# Patient Record
Sex: Male | Born: 1975 | Race: White | Hispanic: No | Marital: Single | Smoking: Former smoker
Health system: Southern US, Community
[De-identification: ages and names within clinical notes are randomized; demographics above are authoritative.]

## PROBLEM LIST (undated history)

## (undated) DIAGNOSIS — E119 Type 2 diabetes mellitus without complications: Secondary | ICD-10-CM

## (undated) DIAGNOSIS — F419 Anxiety disorder, unspecified: Secondary | ICD-10-CM

## (undated) DIAGNOSIS — F99 Mental disorder, not otherwise specified: Secondary | ICD-10-CM

## (undated) DIAGNOSIS — F329 Major depressive disorder, single episode, unspecified: Secondary | ICD-10-CM

## (undated) DIAGNOSIS — R42 Dizziness and giddiness: Secondary | ICD-10-CM

## (undated) DIAGNOSIS — X838XXA Intentional self-harm by other specified means, initial encounter: Secondary | ICD-10-CM

## (undated) DIAGNOSIS — R569 Unspecified convulsions: Secondary | ICD-10-CM

## (undated) DIAGNOSIS — G4730 Sleep apnea, unspecified: Secondary | ICD-10-CM

## (undated) DIAGNOSIS — E669 Obesity, unspecified: Secondary | ICD-10-CM

## (undated) DIAGNOSIS — I10 Essential (primary) hypertension: Secondary | ICD-10-CM

## (undated) DIAGNOSIS — F32A Depression, unspecified: Secondary | ICD-10-CM

## (undated) DIAGNOSIS — G2581 Restless legs syndrome: Secondary | ICD-10-CM

## (undated) HISTORY — PX: VAGUS NERVE STIMULATOR INSERTION: SHX348

## (undated) HISTORY — DX: Restless legs syndrome: G25.81

## (undated) HISTORY — DX: Obesity, unspecified: E66.9

## (undated) HISTORY — PX: SHOULDER SURGERY: SHX246

## (undated) HISTORY — DX: Dizziness and giddiness: R42

## (undated) HISTORY — PX: BURN DEBRIDEMENT SURGERY: SHX582

## (undated) HISTORY — DX: Intentional self-harm by other specified means, initial encounter: X83.8XXA

## (undated) HISTORY — PX: JOINT REPLACEMENT: SHX530

---

## 1997-05-19 ENCOUNTER — Emergency Department (HOSPITAL_COMMUNITY): Admission: EM | Admit: 1997-05-19 | Discharge: 1997-05-19 | Payer: Self-pay | Admitting: Emergency Medicine

## 1997-06-01 ENCOUNTER — Ambulatory Visit (HOSPITAL_COMMUNITY): Admission: RE | Admit: 1997-06-01 | Discharge: 1997-06-01 | Payer: Self-pay

## 1997-07-30 ENCOUNTER — Ambulatory Visit (HOSPITAL_COMMUNITY): Admission: RE | Admit: 1997-07-30 | Discharge: 1997-07-30 | Payer: Self-pay

## 1998-05-21 ENCOUNTER — Emergency Department (HOSPITAL_COMMUNITY): Admission: EM | Admit: 1998-05-21 | Discharge: 1998-05-21 | Payer: Self-pay | Admitting: Emergency Medicine

## 1999-03-17 ENCOUNTER — Emergency Department (HOSPITAL_COMMUNITY): Admission: EM | Admit: 1999-03-17 | Discharge: 1999-03-17 | Payer: Self-pay | Admitting: Emergency Medicine

## 1999-08-16 ENCOUNTER — Encounter: Payer: Self-pay | Admitting: Emergency Medicine

## 1999-08-16 ENCOUNTER — Emergency Department (HOSPITAL_COMMUNITY): Admission: EM | Admit: 1999-08-16 | Discharge: 1999-08-16 | Payer: Self-pay | Admitting: Emergency Medicine

## 2000-05-29 ENCOUNTER — Ambulatory Visit (HOSPITAL_COMMUNITY): Admission: RE | Admit: 2000-05-29 | Discharge: 2000-05-29 | Payer: Self-pay | Admitting: Family Medicine

## 2000-05-29 ENCOUNTER — Encounter: Payer: Self-pay | Admitting: Family Medicine

## 2000-10-25 ENCOUNTER — Emergency Department (HOSPITAL_COMMUNITY): Admission: EM | Admit: 2000-10-25 | Discharge: 2000-10-25 | Payer: Self-pay | Admitting: Emergency Medicine

## 2001-03-25 ENCOUNTER — Emergency Department (HOSPITAL_COMMUNITY): Admission: EM | Admit: 2001-03-25 | Discharge: 2001-03-25 | Payer: Self-pay | Admitting: Emergency Medicine

## 2002-08-31 ENCOUNTER — Ambulatory Visit (HOSPITAL_COMMUNITY): Admission: RE | Admit: 2002-08-31 | Discharge: 2002-08-31 | Payer: Self-pay | Admitting: Family Medicine

## 2002-08-31 ENCOUNTER — Encounter: Payer: Self-pay | Admitting: Family Medicine

## 2003-08-26 ENCOUNTER — Emergency Department (HOSPITAL_COMMUNITY): Admission: EM | Admit: 2003-08-26 | Discharge: 2003-08-26 | Payer: Self-pay | Admitting: Emergency Medicine

## 2004-02-05 ENCOUNTER — Emergency Department (HOSPITAL_COMMUNITY): Admission: EM | Admit: 2004-02-05 | Discharge: 2004-02-05 | Payer: Self-pay | Admitting: Emergency Medicine

## 2004-05-10 ENCOUNTER — Emergency Department (HOSPITAL_COMMUNITY): Admission: EM | Admit: 2004-05-10 | Discharge: 2004-05-10 | Payer: Self-pay | Admitting: Emergency Medicine

## 2004-07-02 ENCOUNTER — Emergency Department (HOSPITAL_COMMUNITY): Admission: EM | Admit: 2004-07-02 | Discharge: 2004-07-02 | Payer: Self-pay | Admitting: Emergency Medicine

## 2004-09-03 ENCOUNTER — Emergency Department (HOSPITAL_COMMUNITY): Admission: EM | Admit: 2004-09-03 | Discharge: 2004-09-03 | Payer: Self-pay | Admitting: *Deleted

## 2004-09-27 ENCOUNTER — Encounter (HOSPITAL_COMMUNITY): Admission: RE | Admit: 2004-09-27 | Discharge: 2004-12-26 | Payer: Self-pay | Admitting: Internal Medicine

## 2004-12-07 ENCOUNTER — Emergency Department (HOSPITAL_COMMUNITY): Admission: EM | Admit: 2004-12-07 | Discharge: 2004-12-07 | Payer: Self-pay | Admitting: Emergency Medicine

## 2004-12-08 ENCOUNTER — Emergency Department (HOSPITAL_COMMUNITY): Admission: EM | Admit: 2004-12-08 | Discharge: 2004-12-09 | Payer: Self-pay | Admitting: Emergency Medicine

## 2005-01-26 ENCOUNTER — Encounter: Admission: RE | Admit: 2005-01-26 | Discharge: 2005-02-21 | Payer: Self-pay | Admitting: Orthopedic Surgery

## 2005-02-22 ENCOUNTER — Encounter: Admission: RE | Admit: 2005-02-22 | Discharge: 2005-05-23 | Payer: Self-pay | Admitting: Orthopedic Surgery

## 2005-04-17 ENCOUNTER — Emergency Department (HOSPITAL_COMMUNITY): Admission: EM | Admit: 2005-04-17 | Discharge: 2005-04-17 | Payer: Self-pay | Admitting: Family Medicine

## 2005-11-11 ENCOUNTER — Emergency Department (HOSPITAL_COMMUNITY): Admission: EM | Admit: 2005-11-11 | Discharge: 2005-11-11 | Payer: Self-pay | Admitting: Emergency Medicine

## 2007-01-07 ENCOUNTER — Inpatient Hospital Stay (HOSPITAL_COMMUNITY): Admission: EM | Admit: 2007-01-07 | Discharge: 2007-01-07 | Payer: Self-pay | Admitting: Emergency Medicine

## 2007-05-16 ENCOUNTER — Emergency Department (HOSPITAL_COMMUNITY): Admission: EM | Admit: 2007-05-16 | Discharge: 2007-05-17 | Payer: Self-pay | Admitting: Emergency Medicine

## 2008-07-21 ENCOUNTER — Emergency Department (HOSPITAL_COMMUNITY): Admission: EM | Admit: 2008-07-21 | Discharge: 2008-07-22 | Payer: Self-pay | Admitting: Emergency Medicine

## 2008-08-10 ENCOUNTER — Emergency Department (HOSPITAL_COMMUNITY): Admission: EM | Admit: 2008-08-10 | Discharge: 2008-08-10 | Payer: Self-pay | Admitting: Emergency Medicine

## 2009-04-25 ENCOUNTER — Emergency Department (HOSPITAL_COMMUNITY): Admission: EM | Admit: 2009-04-25 | Discharge: 2009-04-25 | Payer: Self-pay | Admitting: Emergency Medicine
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
## 2010-05-07 ENCOUNTER — Emergency Department (HOSPITAL_COMMUNITY)
Admission: EM | Admit: 2010-05-07 | Discharge: 2010-05-07 | Disposition: A | Discharge status: Home / Self Care | Payer: Medicare Other | Attending: Emergency Medicine | Admitting: Emergency Medicine

## 2010-05-07 ENCOUNTER — Emergency Department (HOSPITAL_COMMUNITY): Payer: Medicare Other

## 2010-05-07 DIAGNOSIS — S93409A Sprain of unspecified ligament of unspecified ankle, initial encounter: Secondary | ICD-10-CM | POA: Insufficient documentation

## 2010-05-07 DIAGNOSIS — S52599A Other fractures of lower end of unspecified radius, initial encounter for closed fracture: Secondary | ICD-10-CM | POA: Insufficient documentation

## 2010-05-07 DIAGNOSIS — I10 Essential (primary) hypertension: Secondary | ICD-10-CM | POA: Insufficient documentation

## 2010-05-07 DIAGNOSIS — IMO0002 Reserved for concepts with insufficient information to code with codable children: Secondary | ICD-10-CM | POA: Insufficient documentation

## 2010-05-07 DIAGNOSIS — M79609 Pain in unspecified limb: Secondary | ICD-10-CM | POA: Insufficient documentation

## 2010-05-07 DIAGNOSIS — G40909 Epilepsy, unspecified, not intractable, without status epilepticus: Secondary | ICD-10-CM | POA: Insufficient documentation

## 2010-05-07 DIAGNOSIS — S0990XA Unspecified injury of head, initial encounter: Secondary | ICD-10-CM | POA: Insufficient documentation

## 2010-05-07 DIAGNOSIS — Y92410 Unspecified street and highway as the place of occurrence of the external cause: Secondary | ICD-10-CM | POA: Insufficient documentation

## 2010-05-07 DIAGNOSIS — S0180XA Unspecified open wound of other part of head, initial encounter: Secondary | ICD-10-CM | POA: Insufficient documentation

## 2010-05-07 DIAGNOSIS — R079 Chest pain, unspecified: Secondary | ICD-10-CM | POA: Insufficient documentation

## 2010-05-22 LAB — PHENYTOIN LEVEL, TOTAL
Phenytoin Lvl: 5.4 ug/mL — ABNORMAL LOW (ref 10.0–20.0)
Phenytoin Lvl: 5.7 ug/mL — ABNORMAL LOW (ref 10.0–20.0)

## 2010-06-27 NOTE — Procedures (Signed)
EEG NUMBER:  [phone]   CLINICAL HISTORY:  The patient is a 35 year old with a history of  seizure disorder who was admitted in status epilepticus related to  medical noncompliance.  He is obese.  He has a history of bipolar  affective disorder. (345.10)   CURRENT MEDICATIONS INCLUDE:  1. Lyrica.  2. Dilantin.  3. Lamictal.  4. He also received Ativan.   PROCEDURE:  The tracing was carried out on a 32-channel digital Cadwell  recorder reformatted into 16-channel montages, with one devoted to EKG.  The patient was awake and drowsy during the recording.  The  international 10/20 system lead placement was used.   DESCRIPTION OF FINDINGS:  Dominant frequency is a 5 Hz 25 mV activity  that is present when the patient is clearly awake.  Background activity  during drowsiness is mixed frequency theta and frontally predominant  delta range activity, the delta ranging from 25-50 mV distributed in the  frontal and central regions.  Background was symmetric.  There was no  focal slowing.  There was a single sharp transient at F4 (right frontal  region).  This is not definitely epileptogenic from an electrographic  viewpoint.  There is no focal slowing.  EKG showed a regular sinus  rhythm with ventricular response of 96 beats per minute.  Photic  stimulation failed to induce a driving response.  Hyperventilation  caused arousal in the background over a 5-minute period.   IMPRESSION:  Abnormal electroencephalogram on the basis of diffuse  background slowing.  This is a nonspecific indicator of neuronal  dysfunction that may be on a primary degenerative basis or secondary to  a variety of toxic or metabolic etiologies including postictal state and  medication effect.      Deanna Artis. Sharene Skeans, M.D.  Electronically Signed     EAV:WUJW  D:  01/07/2007 12:53:29  T:  01/07/2007 14:08:14  Job #:  [id number]   cc:   Marlan Palau, M.D.  Fax: [phone]
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
## 2010-11-07 LAB — URINALYSIS, ROUTINE W REFLEX MICROSCOPIC
Bilirubin Urine: NEGATIVE
Glucose, UA: NEGATIVE
Ketones, ur: NEGATIVE
Leukocytes, UA: NEGATIVE
Nitrite: NEGATIVE
Protein, ur: 100 — AB
Specific Gravity, Urine: 1.022
Urobilinogen, UA: 0.2
pH: 5

## 2010-11-07 LAB — PHENYTOIN LEVEL, TOTAL: Phenytoin Lvl: 6.7 — ABNORMAL LOW

## 2010-11-07 LAB — POCT I-STAT, CHEM 8
BUN: 18
Calcium, Ion: 1.23
Chloride: 110
Creatinine, Ser: 1
Glucose, Bld: 129 — ABNORMAL HIGH
HCT: 53 — ABNORMAL HIGH
Hemoglobin: 18 — ABNORMAL HIGH
Potassium: 3.5
Sodium: 146 — ABNORMAL HIGH
TCO2: 15

## 2010-11-07 LAB — URINE MICROSCOPIC-ADD ON

## 2010-11-07 LAB — HEPATITIS B SURFACE ANTIGEN: Hepatitis B Surface Ag: NEGATIVE

## 2010-11-07 LAB — HEPATITIS C ANTIBODY, REFLEX: HCV Ab: NEGATIVE

## 2010-11-07 LAB — CARBAMAZEPINE LEVEL, TOTAL: Carbamazepine Lvl: <2 — ABNORMAL LOW

## 2010-11-07 LAB — HIV RAPID SCREEN (BLD OR BODY FLD EXPOSURE): Rapid HIV Screen: NONREACTIVE

## 2010-11-20 ENCOUNTER — Ambulatory Visit
Admission: RE | Admit: 2010-11-20 | Discharge: 2010-11-20 | Disposition: A | Discharge status: Home / Self Care | Payer: Medicare Other | Source: Ambulatory Visit | Attending: Neurology | Admitting: Neurology

## 2010-11-20 ENCOUNTER — Other Ambulatory Visit: Payer: Self-pay | Admitting: Neurology

## 2010-11-20 DIAGNOSIS — G40909 Epilepsy, unspecified, not intractable, without status epilepticus: Secondary | ICD-10-CM

## 2010-11-21 LAB — BASIC METABOLIC PANEL
CO2: 18 — ABNORMAL LOW
CO2: 25
Calcium: 8.9
Calcium: 9
Chloride: 109
Creatinine, Ser: 0.99
GFR calc Af Amer: >60
Glucose, Bld: 114 — ABNORMAL HIGH
Potassium: 4.1
Sodium: 143

## 2010-11-21 LAB — CBC
HCT: 42.5
Hemoglobin: 14.5
MCHC: 34.2
MCHC: 34.5
MCV: 93.7
RBC: 4.71
RDW: 13.1
WBC: 17.8 — ABNORMAL HIGH

## 2010-11-21 LAB — VALPROIC ACID LEVEL: Valproic Acid Lvl: <10 — ABNORMAL LOW

## 2010-11-21 LAB — DIFFERENTIAL
Basophils Relative: 0
Monocytes Relative: 3
Neutro Abs: 16.5 — ABNORMAL HIGH
Neutrophils Relative %: 93 — ABNORMAL HIGH

## 2010-11-21 LAB — PHENYTOIN LEVEL, TOTAL: Phenytoin Lvl: 4.6 — ABNORMAL LOW

## 2010-11-21 LAB — RAPID URINE DRUG SCREEN, HOSP PERFORMED
Barbiturates: NOT DETECTED
Benzodiazepines: NOT DETECTED

## 2011-03-06 DIAGNOSIS — G40309 Generalized idiopathic epilepsy and epileptic syndromes, not intractable, without status epilepticus: Secondary | ICD-10-CM | POA: Diagnosis not present

## 2011-03-06 DIAGNOSIS — R490 Dysphonia: Secondary | ICD-10-CM | POA: Diagnosis not present

## 2011-03-07 DIAGNOSIS — G40309 Generalized idiopathic epilepsy and epileptic syndromes, not intractable, without status epilepticus: Secondary | ICD-10-CM | POA: Diagnosis not present

## 2011-03-07 DIAGNOSIS — Z5181 Encounter for therapeutic drug level monitoring: Secondary | ICD-10-CM | POA: Diagnosis not present

## 2011-03-20 DIAGNOSIS — J3800 Paralysis of vocal cords and larynx, unspecified: Secondary | ICD-10-CM | POA: Diagnosis not present

## 2011-03-20 DIAGNOSIS — R490 Dysphonia: Secondary | ICD-10-CM | POA: Diagnosis not present

## 2011-03-22 ENCOUNTER — Other Ambulatory Visit (HOSPITAL_COMMUNITY): Payer: Self-pay | Admitting: Otolaryngology

## 2011-03-27 ENCOUNTER — Encounter (HOSPITAL_COMMUNITY): Payer: Self-pay | Admitting: Pharmacy Technician

## 2011-04-02 ENCOUNTER — Encounter (HOSPITAL_COMMUNITY)
Admission: RE | Admit: 2011-04-02 | Discharge: 2011-04-02 | Disposition: A | Discharge status: Home / Self Care | Payer: Medicare Other | Source: Ambulatory Visit | Attending: Otolaryngology | Admitting: Otolaryngology

## 2011-04-02 ENCOUNTER — Encounter (HOSPITAL_COMMUNITY)
Admission: RE | Admit: 2011-04-02 | Discharge: 2011-04-02 | Disposition: A | Discharge status: Home / Self Care | Payer: Medicare Other | Source: Ambulatory Visit | Attending: Anesthesiology | Admitting: Anesthesiology

## 2011-04-02 ENCOUNTER — Encounter (HOSPITAL_COMMUNITY): Payer: Self-pay

## 2011-04-02 ENCOUNTER — Other Ambulatory Visit: Payer: Self-pay

## 2011-04-02 ENCOUNTER — Other Ambulatory Visit (HOSPITAL_COMMUNITY): Payer: Self-pay | Admitting: *Deleted

## 2011-04-02 DIAGNOSIS — F319 Bipolar disorder, unspecified: Secondary | ICD-10-CM | POA: Diagnosis not present

## 2011-04-02 DIAGNOSIS — R490 Dysphonia: Secondary | ICD-10-CM | POA: Diagnosis not present

## 2011-04-02 DIAGNOSIS — J3800 Paralysis of vocal cords and larynx, unspecified: Secondary | ICD-10-CM | POA: Diagnosis not present

## 2011-04-02 DIAGNOSIS — F411 Generalized anxiety disorder: Secondary | ICD-10-CM | POA: Diagnosis not present

## 2011-04-02 DIAGNOSIS — I10 Essential (primary) hypertension: Secondary | ICD-10-CM | POA: Diagnosis not present

## 2011-04-02 DIAGNOSIS — Z79899 Other long term (current) drug therapy: Secondary | ICD-10-CM | POA: Diagnosis not present

## 2011-04-02 DIAGNOSIS — J3801 Paralysis of vocal cords and larynx, unilateral: Secondary | ICD-10-CM | POA: Diagnosis not present

## 2011-04-02 HISTORY — DX: Anxiety disorder, unspecified: F41.9

## 2011-04-02 HISTORY — DX: Essential (primary) hypertension: I10

## 2011-04-02 HISTORY — DX: Mental disorder, not otherwise specified: F99

## 2011-04-02 HISTORY — DX: Unspecified convulsions: R56.9

## 2011-04-02 HISTORY — DX: Major depressive disorder, single episode, unspecified: F32.9

## 2011-04-02 HISTORY — DX: Depression, unspecified: F32.A

## 2011-04-02 LAB — BASIC METABOLIC PANEL
CO2: 30 mEq/L (ref 19–32)
Chloride: 105 mEq/L (ref 96–112)
GFR calc Af Amer: >90 mL/min (ref >90)
Potassium: 4.4 mEq/L (ref 3.5–5.1)
Sodium: 143 mEq/L (ref 135–145)

## 2011-04-02 LAB — SURGICAL PCR SCREEN: MRSA, PCR: NEGATIVE

## 2011-04-02 LAB — CBC
MCH: 31.7 pg (ref 26.0–34.0)
MCV: 89.9 fL (ref 78.0–100.0)
Platelets: 275 10*3/uL (ref 150–400)
RBC: 4.95 MIL/uL (ref 4.22–5.81)
RDW: 12.3 % (ref 11.5–15.5)
WBC: 8.4 10*3/uL (ref 4.0–10.5)

## 2011-04-02 NOTE — Pre-Procedure Instructions (Signed)
21 Eric Fernandez  04/02/2011   Your procedure is scheduled on:  April 11, 2011  Report to [facility] at 1100 AM.  Call this number if you have problems the morning of surgery: [phone]   Remember:   Do not eat food:After Midnight.  May have clear liquids: up to 4 Hours before arrival.  Clear liquids include soda, tea, black coffee, apple or grape juice, broth.  Take these medicines the morning of surgery with A SIP OF WATER: amlodipine, lexapro, lacosamide, lamotrigine, phenytoin, venlafaxine,     Do not wear jewelry, make-up or nail polish.  Do not wear lotions, powders, or perfumes. You may wear deodorant.  Do not shave 48 hours prior to surgery.  Do not bring valuables to the hospital.  Contacts, dentures or bridgework may not be worn into surgery.  Leave suitcase in the car. After surgery it may be brought to your room.  For patients admitted to the hospital, checkout time is 11:00 AM the day of discharge.   Patients discharged the day of surgery will not be allowed to drive home.    Special Instructions: CHG Shower Use Special Wash: 1/2 bottle night before surgery and 1/2 bottle morning of surgery.   Please read over the following fact sheets that you were given: Pain Booklet, Coughing and Deep Breathing, MRSA Information and Surgical Site Infection Prevention

## 2011-04-02 NOTE — Pre-Procedure Instructions (Signed)
21 Eric Fernandez  04/02/2011   Your procedure is scheduled on:  04/11/11  Report to [facility] at 1100 AM.  Call this number if you have problems the morning of surgery: [phone]   Remember:   Do not eat food:After Midnight.  May have clear liquids: up to 4 Hours before arrival.  Clear liquids include soda, tea, black coffee, apple or grape juice, broth.  Take these medicines the morning of surgery with A SIP OF WATER: amlodipine,lexapro,dilantin,effexor   Do not wear jewelry, make-up or nail polish.  Do not wear lotions, powders, or perfumes. You may wear deodorant.  Do not shave 48 hours prior to surgery.  Do not bring valuables to the hospital.  Contacts, dentures or bridgework may not be worn into surgery.  Leave suitcase in the car. After surgery it may be brought to your room.  For patients admitted to the hospital, checkout time is 11:00 AM the day of discharge.   Patients discharged the day of surgery will not be allowed to drive home.  Name and phone number of your driver: family  Special Instructions: CHG Shower Use Special Wash: 1/2 bottle night before surgery and 1/2 bottle morning of surgery.   Please read over the following fact sheets that you were given: Pain Booklet, Coughing and Deep Breathing, MRSA Information and Surgical Site Infection Prevention

## 2011-04-11 ENCOUNTER — Encounter (HOSPITAL_COMMUNITY)
Admission: RE | Disposition: A | Discharge status: Home / Self Care | Payer: Self-pay | Source: Ambulatory Visit | Attending: Otolaryngology

## 2011-04-11 ENCOUNTER — Encounter (HOSPITAL_COMMUNITY): Payer: Self-pay | Admitting: Anesthesiology

## 2011-04-11 ENCOUNTER — Encounter (HOSPITAL_COMMUNITY): Payer: Self-pay | Admitting: *Deleted

## 2011-04-11 ENCOUNTER — Ambulatory Visit (HOSPITAL_COMMUNITY)
Admission: RE | Admit: 2011-04-11 | Discharge: 2011-04-11 | Disposition: A | Discharge status: Home / Self Care | Payer: Medicare Other | Source: Ambulatory Visit | Attending: Otolaryngology | Admitting: Otolaryngology

## 2011-04-11 ENCOUNTER — Ambulatory Visit (HOSPITAL_COMMUNITY): Payer: Medicare Other | Admitting: Anesthesiology

## 2011-04-11 DIAGNOSIS — I10 Essential (primary) hypertension: Secondary | ICD-10-CM | POA: Diagnosis not present

## 2011-04-11 DIAGNOSIS — J3801 Paralysis of vocal cords and larynx, unilateral: Secondary | ICD-10-CM | POA: Insufficient documentation

## 2011-04-11 DIAGNOSIS — R490 Dysphonia: Secondary | ICD-10-CM | POA: Diagnosis not present

## 2011-04-11 DIAGNOSIS — Z79899 Other long term (current) drug therapy: Secondary | ICD-10-CM | POA: Diagnosis not present

## 2011-04-11 DIAGNOSIS — F411 Generalized anxiety disorder: Secondary | ICD-10-CM | POA: Diagnosis not present

## 2011-04-11 DIAGNOSIS — F319 Bipolar disorder, unspecified: Secondary | ICD-10-CM | POA: Diagnosis not present

## 2011-04-11 DIAGNOSIS — J3800 Paralysis of vocal cords and larynx, unspecified: Secondary | ICD-10-CM | POA: Diagnosis not present

## 2011-04-11 HISTORY — PX: DIRECT LARYNGOSCOPY WITH RADIAESSE INJECTION: SHX5328

## 2011-04-11 SURGERY — LARYNGOSCOPY, DIRECT, WITH CALCIUM HYDROXYLAPATITE MICROSPHERE INJECTION
Anesthesia: General | Site: Mouth | Wound class: Clean Contaminated

## 2011-04-11 MED ORDER — MIDAZOLAM HCL 5 MG/5ML IJ SOLN
INTRAMUSCULAR | Status: DC | PRN
  Administered 2011-04-11: 2 mg via INTRAVENOUS

## 2011-04-11 MED ORDER — GLYCOPYRROLATE 0.2 MG/ML IJ SOLN
INTRAMUSCULAR | Status: DC | PRN
  Administered 2011-04-11: .8 mg via INTRAVENOUS

## 2011-04-11 MED ORDER — HYDROCODONE-ACETAMINOPHEN 5-500 MG PO TABS: 1.0000 | ORAL_TABLET | ORAL | Status: AC | PRN

## 2011-04-11 MED ORDER — LACTATED RINGERS IV SOLN
INTRAVENOUS | Status: DC | PRN
  Administered 2011-04-11: 13:00:00 via INTRAVENOUS

## 2011-04-11 MED ORDER — FENTANYL CITRATE 0.05 MG/ML IJ SOLN
INTRAMUSCULAR | Status: DC | PRN
  Administered 2011-04-11: 150 ug via INTRAVENOUS
  Administered 2011-04-11: 100 ug via INTRAVENOUS

## 2011-04-11 MED ORDER — HYDROMORPHONE HCL PF 1 MG/ML IJ SOLN: 0.2500 mg | INTRAMUSCULAR | Status: DC | PRN

## 2011-04-11 MED ORDER — LACTATED RINGERS IV SOLN
INTRAVENOUS | Status: DC
  Administered 2011-04-11: 13:00:00 via INTRAVENOUS

## 2011-04-11 MED ORDER — PROMETHAZINE HCL 25 MG/ML IJ SOLN: 6.2500 mg | INTRAMUSCULAR | Status: DC | PRN

## 2011-04-11 MED ORDER — SODIUM CHLORIDE 0.9 % IR SOLN
Status: DC | PRN
  Administered 2011-04-11: 1000 mL

## 2011-04-11 MED ORDER — PROPOFOL 10 MG/ML IV EMUL
INTRAVENOUS | Status: DC | PRN
  Administered 2011-04-11: 250 mg via INTRAVENOUS

## 2011-04-11 MED ORDER — NEOSTIGMINE METHYLSULFATE 1 MG/ML IJ SOLN
INTRAMUSCULAR | Status: DC | PRN
  Administered 2011-04-11: 4 mg via INTRAVENOUS

## 2011-04-11 MED ORDER — ROCURONIUM BROMIDE 100 MG/10ML IV SOLN
INTRAVENOUS | Status: DC | PRN
  Administered 2011-04-11: 40 mg via INTRAVENOUS
  Administered 2011-04-11: 10 mg via INTRAVENOUS

## 2011-04-11 MED ORDER — MEPERIDINE HCL 25 MG/ML IJ SOLN: 6.2500 mg | INTRAMUSCULAR | Status: DC | PRN

## 2011-04-11 MED ORDER — HYDROCODONE-ACETAMINOPHEN 5-325 MG PO TABS
1.0000 | ORAL_TABLET | Freq: Once | ORAL | Status: AC
  Administered 2011-04-11: 1 via ORAL

## 2011-04-11 MED ORDER — DEXAMETHASONE SODIUM PHOSPHATE 10 MG/ML IJ SOLN
INTRAMUSCULAR | Status: DC | PRN
  Administered 2011-04-11: 10 mg via INTRAVENOUS

## 2011-04-11 SURGICAL SUPPLY — 28 items
BALLN PULM 15 16.5 18X75 (BALLOONS)
BALLOON PULM 15 16.5 18X75 (BALLOONS) IMPLANT
CANISTER SUCTION 2500CC (MISCELLANEOUS) ×3 IMPLANT
CLOTH BEACON ORANGE TIMEOUT ST (SAFETY) ×3 IMPLANT
CONT SPEC 4OZ CLIKSEAL STRL BL (MISCELLANEOUS) IMPLANT
COVER MAYO STAND STRL (DRAPES) ×2 IMPLANT
COVER TABLE BACK 60X90 (DRAPES) ×3 IMPLANT
CRADLE DONUT ADULT HEAD (MISCELLANEOUS) ×2 IMPLANT
DRAPE PROXIMA HALF (DRAPES) ×3 IMPLANT
GAUZE SPONGE 4X4 16PLY XRAY LF (GAUZE/BANDAGES/DRESSINGS) ×2 IMPLANT
GLOVE BIO SURGEON STRL SZ7.5 (GLOVE) ×3 IMPLANT
GLOVE ECLIPSE 6.5 STRL STRAW (GLOVE) ×2 IMPLANT
GOWN STRL NON-REIN LRG LVL3 (GOWN DISPOSABLE) ×6 IMPLANT
GUARD TEETH (MISCELLANEOUS) ×3 IMPLANT
KIT PROLARN PLUS GEL W/NDL (Prosthesis and Implant ENT) ×2 IMPLANT
KIT ROOM TURNOVER OR (KITS) ×3 IMPLANT
MARKER SKIN DUAL TIP RULER LAB (MISCELLANEOUS) IMPLANT
NS IRRIG 1000ML POUR BTL (IV SOLUTION) ×3 IMPLANT
PAD ARMBOARD 7.5X6 YLW CONV (MISCELLANEOUS) ×6 IMPLANT
PATTIES SURGICAL .5 X1 (DISPOSABLE) IMPLANT
RIGID INJECTION NEEDLE WITH SUPPORT CANNULA-DOUBLE BEVEL ×2 IMPLANT
SOLUTION ANTI FOG 6CC (MISCELLANEOUS) ×2 IMPLANT
SPONGE GAUZE 4X4 12PLY (GAUZE/BANDAGES/DRESSINGS) ×3 IMPLANT
SURGILUBE 2OZ TUBE FLIPTOP (MISCELLANEOUS) ×1 IMPLANT
SYR INFLATE BILIARY GAUGE (MISCELLANEOUS) IMPLANT
TOWEL OR 17X24 6PK STRL BLUE (TOWEL DISPOSABLE) ×6 IMPLANT
TUBE CONNECTING 12X1/4 (SUCTIONS) ×3 IMPLANT
WATER STERILE IRR 1000ML POUR (IV SOLUTION) ×1 IMPLANT

## 2011-04-11 NOTE — Brief Op Note (Signed)
04/11/2011  1:49 PM  PATIENT:  Eric Fernandez  36 y.o. 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   Delay start of Pharmacological VTE agent (>24hrs) due to surgical blood loss or risk of bleeding: not applicable

## 2011-04-11 NOTE — Preoperative (Signed)
Beta Blockers   Reason not to administer Beta Blockers:Not Applicable 
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
## 2011-04-11 NOTE — Transfer of Care (Signed)
Immediate Anesthesia Transfer of Care Note  Patient: Eric Fernandez  Procedure(s) Performed: Procedure(s) (LRB): DIRECT LARYNGOSCOPY WITH RADIAESSE INJECTION (N/A)  Patient Location: PACU  Anesthesia Type: General  Level of Consciousness: awake and alert   Airway & Oxygen Therapy: Patient Spontanous Breathing and Patient connected to face mask oxygen  Post-op Assessment: Report given to PACU RN and Post -op Vital signs reviewed and stable  Post vital signs: Reviewed and stable  Complications: No apparent anesthesia complications

## 2011-04-11 NOTE — Anesthesia Preprocedure Evaluation (Addendum)
Anesthesia Evaluation  Patient identified by MRN, date of birth, ID band Patient awake    Reviewed: Allergy & Precautions, NPO status , Patient's Chart, lab work & pertinent test results  Airway Mallampati: I  Neck ROM: Full    Dental  (+) Teeth Intact   Pulmonary  clear to auscultation        Cardiovascular hypertension, Regular Normal    Neuro/Psych Seizures -,  Anxiety Depression    GI/Hepatic   Endo/Other  Morbid obesity  Renal/GU      Musculoskeletal   Abdominal (+) obese,   Peds  Hematology   Anesthesia Other Findings   Reproductive/Obstetrics                          Anesthesia Physical Anesthesia Plan  ASA: II  Anesthesia Plan: General   Post-op Pain Management:    Induction: Intravenous  Airway Management Planned: Oral ETT  Additional Equipment:   Intra-op Plan:   Post-operative Plan: Extubation in OR  Informed Consent:   Dental advisory given  Plan Discussed with: CRNA and Surgeon  Anesthesia Plan Comments:         Anesthesia Quick Evaluation

## 2011-04-12 NOTE — Op Note (Signed)
NAMEDICKY, BOER                ACCOUNT NO.:  [id number]  MEDICAL RECORD NO.:  [id number]  LOCATION:  [facility]                         FACILITY:  [facility]  PHYSICIAN:  Antony Contras, MD     DATE OF BIRTH:  05-Feb-1976  DATE OF PROCEDURE:  04/11/2011 DATE OF DISCHARGE:  04/11/2011                              OPERATIVE REPORT   PREOPERATIVE DIAGNOSIS:  Left vocal cord paralysis and dysphonia.  POSTOPERATIVE DIAGNOSIS:  Left vocal cord paralysis and dysphonia.  PROCEDURE:  Suspended microdirect laryngoscopy with injection of Radiesse into the left vocal fold totaling 0.5 mL.  SURGEON:  Antony Contras, MD  ANESTHESIA:  General jet Venturi ventilation.  COMPLICATIONS:  None.  INDICATION:  The patient is a 36 year old male who had his left vagal nerve stimulator replaced late last year resulting in a paralyzed left vocal cord.  He has had no improvement really since then.  After discussing options, we agreed to proceed with a Radiesse injection while awaiting to see if function comes back to the recurrent laryngeal nerve. Thus, he presents to the operating room for surgical management.  FINDINGS:  The vocal folds are without lesion or abnormality.  DESCRIPTION OF PROCEDURE:  The patient was identified in the holding room and informed consent having been obtained including discussion of risks, benefits, alternatives, the patient was brought to the operative suite and put on the operative table in a supine position.  Anesthesia was induced under mask ventilation.  His eyes were taped closed.  The bed was turned 90 degrees from anesthesia and a tooth guard was placed. A laser Storz laryngoscope was then inserted into the mouth and into the supraglottic position.  This took a little while to get equipment ready, the patient began to experience desaturation, so the laryngoscope was removed and the patient returned to mask ventilation.  His oxygen level improved very quickly, and so  another attempt was made with the Storz laryngoscope, this time placing it in the supraglottic position and suspending that using a Lewy arm to the Mayo stand.  The airway was suctioned and jet Venturi ventilation was initiated.  The patient's exposure was a bit difficult to get anteriorly without a good bit of pressure.  A pre treatment photograph was made with a 0 degree telescope.  The operating microscope was brought into view, and the Radiesse was then injected into the left vocal fold starting posteriorly and then also doing an anterior injection.  An additional amount was then injected posteriorly.  The posterior injection total of 0.35 mL while the anterior injection was 0.15 mL.  After this was completed and the vocal fold appeared to be well medialized, a post treatment photograph was made with a 0 degree telescope.  Lidocaine 2% was then sprayed against the larynx and suctioned.  Jet Venturi ventilation was then stopped, and the laryngoscope was taken out of suspension and removed from the patient's mouth while suctioning the airway.  He was then returned to mask ventilation and turned back to Anesthesia for wake up, and was moved to the recovery room in stable condition.     Antony Contras, MD     DDB/MEDQ  D:  04/11/2011  T:  04/12/2011  Job:  [id number]

## 2011-04-13 NOTE — Anesthesia Postprocedure Evaluation (Signed)
  Anesthesia Post-op Note  Patient: Eric Fernandez  Procedure(s) Performed: Procedure(s) (LRB): DIRECT LARYNGOSCOPY WITH RADIAESSE INJECTION (N/A)  Patient Location: PACU  Anesthesia Type: General  Level of Consciousness: awake  Airway and Oxygen Therapy: Patient Spontanous Breathing  Post-op Pain: mild  Post-op Assessment: Post-op Vital signs reviewed  Post-op Vital Signs: stable  Complications: No apparent anesthesia complications

## 2011-04-18 ENCOUNTER — Encounter (HOSPITAL_COMMUNITY): Payer: Self-pay | Admitting: Otolaryngology

## 2011-05-20 ENCOUNTER — Other Ambulatory Visit: Payer: Self-pay

## 2011-05-20 ENCOUNTER — Emergency Department (HOSPITAL_COMMUNITY)
Admission: EM | Admit: 2011-05-20 | Discharge: 2011-05-21 | Disposition: A | Discharge status: Other Acute Inpatient Hospital | Payer: Medicare Other | Attending: Emergency Medicine | Admitting: Emergency Medicine

## 2011-05-20 ENCOUNTER — Encounter (HOSPITAL_COMMUNITY): Payer: Self-pay | Admitting: *Deleted

## 2011-05-20 DIAGNOSIS — G40909 Epilepsy, unspecified, not intractable, without status epilepticus: Secondary | ICD-10-CM | POA: Diagnosis not present

## 2011-05-20 DIAGNOSIS — Z79899 Other long term (current) drug therapy: Secondary | ICD-10-CM | POA: Diagnosis not present

## 2011-05-20 DIAGNOSIS — R000 Tachycardia, unspecified: Secondary | ICD-10-CM | POA: Diagnosis not present

## 2011-05-20 DIAGNOSIS — T426X1A Poisoning by other antiepileptic and sedative-hypnotic drugs, accidental (unintentional), initial encounter: Secondary | ICD-10-CM | POA: Diagnosis not present

## 2011-05-20 DIAGNOSIS — T50901A Poisoning by unspecified drugs, medicaments and biological substances, accidental (unintentional), initial encounter: Secondary | ICD-10-CM

## 2011-05-20 DIAGNOSIS — T426X2A Poisoning by other antiepileptic and sedative-hypnotic drugs, intentional self-harm, initial encounter: Secondary | ICD-10-CM | POA: Insufficient documentation

## 2011-05-20 DIAGNOSIS — F329 Major depressive disorder, single episode, unspecified: Secondary | ICD-10-CM

## 2011-05-20 DIAGNOSIS — R4182 Altered mental status, unspecified: Secondary | ICD-10-CM | POA: Diagnosis not present

## 2011-05-20 DIAGNOSIS — R404 Transient alteration of awareness: Secondary | ICD-10-CM | POA: Insufficient documentation

## 2011-05-20 DIAGNOSIS — F341 Dysthymic disorder: Secondary | ICD-10-CM | POA: Diagnosis not present

## 2011-05-20 DIAGNOSIS — I10 Essential (primary) hypertension: Secondary | ICD-10-CM | POA: Diagnosis not present

## 2011-05-20 DIAGNOSIS — T50991A Poisoning by other drugs, medicaments and biological substances, accidental (unintentional), initial encounter: Secondary | ICD-10-CM | POA: Diagnosis not present

## 2011-05-20 DIAGNOSIS — T4272XA Poisoning by unspecified antiepileptic and sedative-hypnotic drugs, intentional self-harm, initial encounter: Secondary | ICD-10-CM | POA: Insufficient documentation

## 2011-05-20 LAB — COMPREHENSIVE METABOLIC PANEL
Alkaline Phosphatase: 160 U/L — ABNORMAL HIGH (ref 39–117)
BUN: 9 mg/dL (ref 6–23)
Chloride: 98 mEq/L (ref 96–112)
Creatinine, Ser: 0.63 mg/dL (ref 0.50–1.35)
GFR calc Af Amer: >90 mL/min (ref >90)
Glucose, Bld: 127 mg/dL — ABNORMAL HIGH (ref 70–99)
Potassium: 3.8 mEq/L (ref 3.5–5.1)
Total Bilirubin: 0.2 mg/dL — ABNORMAL LOW (ref 0.3–1.2)
Total Protein: 6.8 g/dL (ref 6.0–8.3)

## 2011-05-20 LAB — URINALYSIS, ROUTINE W REFLEX MICROSCOPIC
Glucose, UA: NEGATIVE mg/dL
Hgb urine dipstick: NEGATIVE
Ketones, ur: NEGATIVE mg/dL
Protein, ur: NEGATIVE mg/dL
Urobilinogen, UA: 0.2 mg/dL (ref 0.0–1.0)

## 2011-05-20 LAB — ETHANOL: Alcohol, Ethyl (B): <11 mg/dL (ref 0–11)

## 2011-05-20 LAB — CBC
HCT: 42.5 % (ref 39.0–52.0)
Hemoglobin: 14.9 g/dL (ref 13.0–17.0)
MCHC: 35.1 g/dL (ref 30.0–36.0)
MCV: 90 fL (ref 78.0–100.0)
RDW: 12.1 % (ref 11.5–15.5)

## 2011-05-20 LAB — PHENYTOIN LEVEL, TOTAL: Phenytoin Lvl: 14.9 ug/mL (ref 10.0–20.0)

## 2011-05-20 MED ORDER — LACOSAMIDE 50 MG PO TABS
200.0000 mg | ORAL_TABLET | Freq: Two times a day (BID) | ORAL | Status: DC
  Administered 2011-05-20 – 2011-05-21 (×2): 200 mg via ORAL
  Filled 2011-05-20 (×2): qty 4

## 2011-05-20 MED ORDER — ESCITALOPRAM OXALATE 10 MG PO TABS
10.0000 mg | ORAL_TABLET | Freq: Every day | ORAL | Status: DC
  Administered 2011-05-20 – 2011-05-21 (×2): 10 mg via ORAL
  Filled 2011-05-20 (×2): qty 1

## 2011-05-20 MED ORDER — LAMOTRIGINE 200 MG PO TABS
400.0000 mg | ORAL_TABLET | Freq: Two times a day (BID) | ORAL | Status: DC
  Administered 2011-05-20 – 2011-05-21 (×2): 400 mg via ORAL
  Filled 2011-05-20 (×3): qty 2

## 2011-05-20 MED ORDER — AMLODIPINE BESYLATE 5 MG PO TABS
5.0000 mg | ORAL_TABLET | Freq: Every day | ORAL | Status: DC
Start: 2011-05-21 — End: 2011-05-21
  Administered 2011-05-21: 5 mg via ORAL
  Filled 2011-05-20: qty 1

## 2011-05-20 MED ORDER — SODIUM CHLORIDE 0.9 % IV SOLN
INTRAVENOUS | Status: DC
  Administered 2011-05-20: 17:00:00 via INTRAVENOUS

## 2011-05-20 MED ORDER — ACETAMINOPHEN 325 MG PO TABS: 650.0000 mg | ORAL_TABLET | ORAL | Status: DC | PRN

## 2011-05-20 MED ORDER — ONDANSETRON HCL 4 MG PO TABS: 4.0000 mg | ORAL_TABLET | Freq: Three times a day (TID) | ORAL | Status: DC | PRN

## 2011-05-20 MED ORDER — ZOLPIDEM TARTRATE 10 MG PO TABS
10.0000 mg | ORAL_TABLET | Freq: Every day | ORAL | Status: DC
  Administered 2011-05-20: 10 mg via ORAL
  Filled 2011-05-20: qty 1

## 2011-05-20 MED ORDER — PHENYTOIN SODIUM EXTENDED 100 MG PO CAPS: 200.0000 mg | ORAL_CAPSULE | ORAL | Status: DC

## 2011-05-20 MED ORDER — VENLAFAXINE HCL ER 75 MG PO CP24
225.0000 mg | ORAL_CAPSULE | Freq: Every day | ORAL | Status: DC
  Administered 2011-05-21: 225 mg via ORAL
  Filled 2011-05-20: qty 1

## 2011-05-20 MED ORDER — PHENYTOIN SODIUM EXTENDED 100 MG PO CAPS
200.0000 mg | ORAL_CAPSULE | Freq: Every day | ORAL | Status: DC
  Administered 2011-05-21: 200 mg via ORAL
  Filled 2011-05-20: qty 2

## 2011-05-20 MED ORDER — PHENYTOIN SODIUM EXTENDED 100 MG PO CAPS
300.0000 mg | ORAL_CAPSULE | Freq: Every day | ORAL | Status: DC
  Administered 2011-05-20: 300 mg via ORAL
  Filled 2011-05-20: qty 3
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
## 2011-05-20 NOTE — ED Notes (Signed)
Telepsych in process 
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
## 2011-05-20 NOTE — ED Notes (Signed)
Pts family at bedside talking with MD, states that pt is unsafe to return home. Pt has previous suicide attempts. Dad reports that pt called him and stated " If you don't come over here right now, I'm going to overdose." When pts dad arrived to pts home pt proceeded to take [place]. Pts mom reports that pt is "convincing and you will believe him if he tells you the moon is pink."

## 2011-05-20 NOTE — BH Assessment (Signed)
Dr. Jacky Kindle, telepsych MD, recommended pt be discharged and Penal[facility] reversed IVC papers. Apparently, Penalver wasn't privy to the information re: pt's previous suicide attempts (info provided by pt's parents) when consult was conducted. Writer spoke with EDP Jacubowitz. EDP and writer are in agreement that pt poses a danger to himself and shouldn't be d/c. Writer will prepare new IVC papers which EDP will sign.

## 2011-05-20 NOTE — BH Assessment (Signed)
Writer faxed paperwork to [employer]. Writer explained re: second IVC affidavit faxed tonight. Magistrate Diona Browner confirmed receipt of fax and confirmed understanding of need for 2nd set of IVC paperwork.

## 2011-05-21 DIAGNOSIS — F489 Nonpsychotic mental disorder, unspecified: Secondary | ICD-10-CM | POA: Diagnosis not present

## 2011-05-21 DIAGNOSIS — F329 Major depressive disorder, single episode, unspecified: Secondary | ICD-10-CM | POA: Diagnosis not present

## 2011-05-21 DIAGNOSIS — Z5181 Encounter for therapeutic drug level monitoring: Secondary | ICD-10-CM | POA: Diagnosis not present

## 2011-05-21 DIAGNOSIS — F332 Major depressive disorder, recurrent severe without psychotic features: Secondary | ICD-10-CM | POA: Diagnosis not present

## 2011-05-21 DIAGNOSIS — F339 Major depressive disorder, recurrent, unspecified: Secondary | ICD-10-CM | POA: Diagnosis present

## 2011-05-21 DIAGNOSIS — R569 Unspecified convulsions: Secondary | ICD-10-CM | POA: Diagnosis not present

## 2011-05-21 DIAGNOSIS — Z79899 Other long term (current) drug therapy: Secondary | ICD-10-CM | POA: Diagnosis not present

## 2011-05-21 DIAGNOSIS — G40802 Other epilepsy, not intractable, without status epilepticus: Secondary | ICD-10-CM | POA: Diagnosis not present

## 2011-05-21 DIAGNOSIS — IMO0002 Reserved for concepts with insufficient information to code with codable children: Secondary | ICD-10-CM | POA: Diagnosis not present

## 2011-05-21 DIAGNOSIS — G40909 Epilepsy, unspecified, not intractable, without status epilepticus: Secondary | ICD-10-CM | POA: Diagnosis present

## 2011-05-21 DIAGNOSIS — R4182 Altered mental status, unspecified: Secondary | ICD-10-CM | POA: Diagnosis not present

## 2011-05-21 DIAGNOSIS — R45851 Suicidal ideations: Secondary | ICD-10-CM | POA: Diagnosis not present

## 2011-05-21 DIAGNOSIS — I10 Essential (primary) hypertension: Secondary | ICD-10-CM | POA: Diagnosis not present

## 2011-05-21 NOTE — BHH Counselor (Signed)
Patient referred to [facility]. Pending review by their staff.   Patient also pending acceptance to [facility] beds are available at this time.

## 2011-05-21 NOTE — ED Provider Notes (Signed)
Pt awake and alert. Content.  Act team indicates pt accepted to [facility], Dr Wendall Stade, bed ready.   Suzi Roots, MD 05/21/11 1248

## 2011-05-21 NOTE — ED Notes (Signed)
Per patient's friend Eric Fernandez and pt.  He only takes Effexor 75mg  daily.

## 2011-05-21 NOTE — Discharge Planning (Signed)
Pt has been accepted to [facility] by Dr. Wendall Stade. Patient to be transported by [facility]. EDP notified.  Eric Fernandez , MSW, LCSWA 05/21/2011 12:47 PM [phone]

## 2011-05-21 NOTE — Discharge Instructions (Signed)
Transfer to [facility] 

## 2011-05-24 DIAGNOSIS — R569 Unspecified convulsions: Secondary | ICD-10-CM | POA: Diagnosis not present

## 2011-05-24 DIAGNOSIS — R4182 Altered mental status, unspecified: Secondary | ICD-10-CM | POA: Diagnosis not present

## 2011-06-01 DIAGNOSIS — F39 Unspecified mood [affective] disorder: Secondary | ICD-10-CM | POA: Diagnosis not present

## 2011-06-11 DIAGNOSIS — F39 Unspecified mood [affective] disorder: Secondary | ICD-10-CM | POA: Diagnosis not present

## 2011-06-13 DIAGNOSIS — F39 Unspecified mood [affective] disorder: Secondary | ICD-10-CM | POA: Diagnosis not present

## 2011-06-25 DIAGNOSIS — F39 Unspecified mood [affective] disorder: Secondary | ICD-10-CM | POA: Diagnosis not present

## 2011-06-27 DIAGNOSIS — R490 Dysphonia: Secondary | ICD-10-CM | POA: Diagnosis not present

## 2011-06-27 DIAGNOSIS — F39 Unspecified mood [affective] disorder: Secondary | ICD-10-CM | POA: Diagnosis not present

## 2011-06-27 DIAGNOSIS — G40309 Generalized idiopathic epilepsy and epileptic syndromes, not intractable, without status epilepticus: Secondary | ICD-10-CM | POA: Diagnosis not present

## 2011-07-12 DIAGNOSIS — F39 Unspecified mood [affective] disorder: Secondary | ICD-10-CM | POA: Diagnosis not present

## 2011-07-16 DIAGNOSIS — F39 Unspecified mood [affective] disorder: Secondary | ICD-10-CM | POA: Diagnosis not present

## 2011-08-06 DIAGNOSIS — F39 Unspecified mood [affective] disorder: Secondary | ICD-10-CM | POA: Diagnosis not present

## 2011-08-07 DIAGNOSIS — F39 Unspecified mood [affective] disorder: Secondary | ICD-10-CM | POA: Diagnosis not present

## 2011-08-07 DIAGNOSIS — Z79899 Other long term (current) drug therapy: Secondary | ICD-10-CM | POA: Diagnosis not present

## 2011-09-04 DIAGNOSIS — F39 Unspecified mood [affective] disorder: Secondary | ICD-10-CM | POA: Diagnosis not present

## 2011-09-05 DIAGNOSIS — F39 Unspecified mood [affective] disorder: Secondary | ICD-10-CM | POA: Diagnosis not present

## 2011-10-16 DIAGNOSIS — F39 Unspecified mood [affective] disorder: Secondary | ICD-10-CM | POA: Diagnosis not present

## 2011-12-11 DIAGNOSIS — F39 Unspecified mood [affective] disorder: Secondary | ICD-10-CM | POA: Diagnosis not present

## 2011-12-13 DIAGNOSIS — G40309 Generalized idiopathic epilepsy and epileptic syndromes, not intractable, without status epilepticus: Secondary | ICD-10-CM | POA: Diagnosis not present

## 2012-05-06 ENCOUNTER — Ambulatory Visit: Payer: Self-pay | Admitting: Neurology

## 2012-05-06 ENCOUNTER — Encounter: Payer: Self-pay | Admitting: Neurology

## 2012-05-06 ENCOUNTER — Ambulatory Visit (INDEPENDENT_AMBULATORY_CARE_PROVIDER_SITE_OTHER): Payer: Medicare Other | Admitting: Neurology

## 2012-05-06 VITALS — BP 137/86 | HR 73 | Ht 68.5 in | Wt 264.0 lb

## 2012-05-06 DIAGNOSIS — Z5181 Encounter for therapeutic drug level monitoring: Secondary | ICD-10-CM

## 2012-05-06 DIAGNOSIS — G40319 Generalized idiopathic epilepsy and epileptic syndromes, intractable, without status epilepticus: Secondary | ICD-10-CM | POA: Diagnosis not present

## 2012-05-06 DIAGNOSIS — R490 Dysphonia: Secondary | ICD-10-CM | POA: Insufficient documentation

## 2012-05-06 NOTE — Addendum Note (Signed)
Addended by: Stephanie Acre on: 05/06/2012 08:13 PM   Modules accepted: Orders

## 2012-05-06 NOTE — Progress Notes (Deleted)
  Reason for visit: ***  Eric Fernandez is an 37 y.o. male  History of present illness:  ***  ROS:  Out of a complete 14 system review of symptoms, the patient complains only of the following symptoms, and all other reviewed systems are negative.  ***  Blood pressure 137/86, pulse 73, height 5' 8.5" (1.74 m), weight 264 lb (119.75 kg).  Physical Exam  General: The patient is alert and cooperative at the time of the examination.  Head: Pupils are equal, round, and reactive to light. Discs are flat bilaterally.  Neck: The neck is supple, no carotid bruits are noted.  Respiratory: The respiratory examination is clear.  Cardiovascular: The cardiovascular examination reveals a regular rate and rhythm, no obvious murmurs or rubs are noted.  Skin: Extremities are without significant edema.  Neurologic Exam  Mental status:  Cranial nerves: Facial symmetry is present. There is good sensation of the face to pinprick and soft touch bilaterally. The strength of the facial muscles and the muscles to head turning and shoulder shrug are normal bilaterally. Speech is well enunciated, no aphasia or dysarthria is noted. Extraocular movements are full. Visual fields are full.  Motor: The motor testing reveals 5 over 5 strength of all 4 extremities. Good symmetric motor tone is noted throughout.  Sensory: Sensory testing is intact to pinprick, soft touch, vibration sensation, and position sense on all 4 extremities. No evidence of extinction is noted.  Coordination: Cerebellar testing reveals good finger-nose-finger and heel-to-shin bilaterally.  Gait and station: Gait is normal. Tandem gait is normal. Romberg is negative. No drift is seen  Reflexes: Deep tendon reflexes are symmetric and normal bilaterally. Toes are downgoing bilaterally.   Assessment/Plan:  Marlan Palau MD 05/06/2012 9:29 AM

## 2012-05-06 NOTE — Procedures (Signed)
Eric Fernandez is a 37 year old patient with a history of intractable seizures. He continues to have events of "phasing out", and he is on 3 different anticonvulsants at this time. He has a vagal nerve stimulator in place for seizure control.   VAGAL NERVE STIMULATOR SETTINGS:  Output current: 1.5 milliamps  Signal frequency: 30 Hz  Pulse width: 250 microseconds  On time: 30 seconds  Off time: 3.0 minutes to 1.8 minutes  Magnet current: 2.0 milliamps  Magnet on time: 60 seconds  Pulse width: 250 microseconds  Activations: 12   Output current: 1.0 milliamps  Lead impedance: 2784 Ohms  IFI: No  The VNS unit was interrogated, and the settings were changed as above. The patient appeared to tolerate the changes well.  Marlan Palau MD 05/06/2012 8:09 PM

## 2012-05-06 NOTE — Progress Notes (Signed)
   Reason for visit: Seizures  Eric Fernandez is an 37 y.o. male  History of present illness:  Eric Fernandez is a 37 year old right-handed white male with a history of intractable seizures. The patient is on multiple seizure medications that include Dilantin, Lamictal, and Vimpat. The patient continues to have "phase outs" that occur with some regularity. The patient has recently noted within the last several months that he is having some myoclonus during the day. The patient has had some nocturnal myoclonus prior to this. The patient is on Lexapro, and he is also on lithium. The patient has noted some tremors from the lithium. The patient indicates that he may occasionally fall. The patient does not operate a motor vehicle, but he will ride bike around town. The patient is not having excessive drowsiness from medications. The patient has tried to cut back on his caffeine intake. The patient returns for an evaluation.  ROS:  Out of a complete 14 system review of symptoms, the patient complains only of the following symptoms, and all other reviewed systems are negative.  Weight gain Seizures Tremor Depression Restless legs  Blood pressure 137/86, pulse 73, height 5' 8.5" (1.74 m), weight 264 lb (119.75 kg).  Physical Exam  General: The patient is alert and cooperative at the time of the examination. The patient is moderately obese.  Skin: No significant peripheral edema is noted.   Neurologic Exam  Cranial nerves: Facial symmetry is present. Speech is normal, no aphasia or dysarthria is noted. Extraocular movements are full. Visual fields are full.  Motor: The patient has good strength in all 4 extremities.  Coordination: The patient has good finger-nose-finger and heel-to-shin bilaterally.  Gait and station: The patient has a normal gait. Tandem gait is normal. Romberg is negative. No drift is seen.  Reflexes: Deep tendon reflexes are symmetric.   Assessment/Plan:  1.  Intractable seizures  2. 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 The patient will be sent for blood work today. If the blood levels of seizure medications are adequate, and the myoclonus continues, addition of clonazepam at nighttime may be helpful. The patient will followup otherwise through this office in 6 months.  Marlan Palau MD 05/06/2012 9:29 AM

## 2012-05-06 NOTE — Patient Instructions (Signed)
We will check blood work today. °

## 2012-05-08 ENCOUNTER — Telehealth: Payer: Self-pay | Admitting: Neurology

## 2012-05-08 LAB — COMPREHENSIVE METABOLIC PANEL
Albumin/Globulin Ratio: 2.5 (ref 1.1–2.5)
Albumin: 4.7 g/dL (ref 3.5–5.5)
BUN: 10 mg/dL (ref 6–20)
Calcium: 9 mg/dL (ref 8.7–10.2)
Creatinine, Ser: 0.77 mg/dL (ref 0.76–1.27)
GFR calc Af Amer: 135 mL/min/{1.73_m2} (ref >59)
GFR calc non Af Amer: 117 mL/min/{1.73_m2} (ref >59)
Glucose: 146 mg/dL — ABNORMAL HIGH (ref 65–99)
Total Bilirubin: 0.1 mg/dL (ref 0.0–1.2)
Total Protein: 6.6 g/dL (ref 6.0–8.5)

## 2012-05-08 LAB — CBC WITH DIFFERENTIAL/PLATELET
Basos: 0 % (ref 0–3)
Eos: 1 % (ref 0–5)
Hemoglobin: 15 g/dL (ref 12.6–17.7)
Immature Grans (Abs): 0.1 10*3/uL (ref 0.0–0.1)
Lymphs: 23 % (ref 14–46)
MCH: 31.4 pg (ref 26.6–33.0)
Monocytes: 6 % (ref 4–12)
Neutrophils Absolute: 5.4 10*3/uL (ref 1.4–7.0)
RBC: 4.77 x10E6/uL (ref 4.14–5.80)
WBC: 7.8 10*3/uL (ref 3.4–10.8)

## 2012-05-08 LAB — LAMOTRIGINE LEVEL: Lamotrigine Lvl: 1.7 ug/mL — ABNORMAL LOW (ref 2.0–20.0)

## 2012-05-08 NOTE — Telephone Encounter (Signed)
I called the patient. The blood work is unremarkable, minimal elevation in alkaline phosphatase level. The lamotrigine level is low, but the patient is on 1000 mg of this medication daily already. The lithium level is within the therapeutic range, and the Dilantin level is around 17. We will not readjust medications at this time. The vagal nerve stimulator was readjusted on the last visit. The patient will followup on a routine basis. I discussed the lab results with the patient.

## 2012-06-21 ENCOUNTER — Other Ambulatory Visit: Payer: Self-pay | Admitting: Neurology

## 2012-06-30 ENCOUNTER — Other Ambulatory Visit: Payer: Self-pay | Admitting: Neurology

## 2012-07-21 ENCOUNTER — Telehealth: Payer: Self-pay | Admitting: Neurology

## 2012-07-21 NOTE — Telephone Encounter (Signed)
Patient is calling to ask Dr. Anne Hahn if he will do some blood work on him (did not specify) and also he has a question (did not state what) He's asks for someone to give hima a call back at their earliest convenience.  His number is:  [phone]

## 2012-07-21 NOTE — Telephone Encounter (Signed)
Agree. No need for blood work now.

## 2012-07-21 NOTE — Telephone Encounter (Signed)
I called pt and he relayed that he needs to have to have his labs checked for kidney function since he is on lithium.   I told him that he had CMP, lithium level, dilantin level, lamictal level drawn.   I told him that labs done in 05-06-12 were good.  (liver and kidney function good).  Dr. Anne Hahn to see him 9/14, that would be 6 mo.   If any change from what I told him, would let him know other wise will see in 9/14.

## 2012-08-08 ENCOUNTER — Other Ambulatory Visit: Payer: Self-pay | Admitting: Neurology

## 2012-08-21 ENCOUNTER — Other Ambulatory Visit: Payer: Self-pay | Admitting: Neurology

## 2012-09-06 ENCOUNTER — Other Ambulatory Visit: Payer: Self-pay | Admitting: Neurology

## 2012-10-03 ENCOUNTER — Telehealth: Payer: Self-pay | Admitting: Neurology

## 2012-10-03 MED ORDER — METRONIDAZOLE 250 MG PO TABS: 250.0000 mg | ORAL_TABLET | Freq: Three times a day (TID) | ORAL | Status: DC

## 2012-10-03 NOTE — Telephone Encounter (Signed)
I called patient. 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 He has had this issue before, and he responded to Flagyl. I will call in this medication.

## 2012-10-03 NOTE — Telephone Encounter (Signed)
I called the patient to obtain more info.  He said he needs ATB for infection/flare up in mouth and gums.  States we have prescribed medication for this in the past and it worked well.  He does not wish to get this from PCP.  Dr Anne Hahn, please advise.  Thank you.

## 2012-10-14 ENCOUNTER — Other Ambulatory Visit: Payer: Self-pay | Admitting: Neurology

## 2012-10-14 NOTE — Telephone Encounter (Signed)
Rx signed and faxed.

## 2012-10-23 ENCOUNTER — Encounter: Payer: Self-pay | Admitting: Neurology

## 2012-11-03 ENCOUNTER — Telehealth: Payer: Self-pay | Admitting: Neurology

## 2012-11-03 NOTE — Telephone Encounter (Signed)
Pt called about refilling his Lithium medication. I informed the pt that he needed to get with the provider that actually prescribed the medication. Pt stated that he could not go to that doctor because he owes a balance and that practice will not refill it until he pays the balance due.

## 2012-11-03 NOTE — Telephone Encounter (Signed)
I agree with what the patient was told. The patient needs to go to the doctor that prescribed the lithium.

## 2012-11-06 ENCOUNTER — Other Ambulatory Visit: Payer: Self-pay | Admitting: Neurology

## 2012-11-06 ENCOUNTER — Encounter: Payer: Self-pay | Admitting: Neurology

## 2012-11-06 ENCOUNTER — Ambulatory Visit (INDEPENDENT_AMBULATORY_CARE_PROVIDER_SITE_OTHER): Payer: Medicare Other | Admitting: Neurology

## 2012-11-06 VITALS — BP 146/85 | HR 75

## 2012-11-06 DIAGNOSIS — G40319 Generalized idiopathic epilepsy and epileptic syndromes, intractable, without status epilepticus: Secondary | ICD-10-CM

## 2012-11-06 DIAGNOSIS — G2581 Restless legs syndrome: Secondary | ICD-10-CM | POA: Diagnosis not present

## 2012-11-06 HISTORY — DX: Restless legs syndrome: G25.81

## 2012-11-06 MED ORDER — LITHIUM CARBONATE 300 MG PO CAPS: 300.0000 mg | ORAL_CAPSULE | Freq: Three times a day (TID) | ORAL | Status: DC
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
## 2012-11-06 NOTE — Procedures (Signed)
  Mr. Eric Fernandez is a 37 year old gentleman with a history of intractable seizures. The patient has had a vagal nerve stimulator placed, and he reports no definite seizures since March of 2014. The patient is having his vagal nerve stimulator settings reinterrogated.  VAGAL NERVE STIMULATOR SETTINGS:  Output current: 150 milliamps  Signal frequency: 30 Hz  Pulse width: 250 microseconds  On time: 30 seconds  Off time: 1.8 minutes  Magnet current: 2.0 milliamps  Magnet on time: 60 seconds  Pulse width: 250 microseconds  Activations: 12  Output current: 1.5 milliamps  Lead impedance: 2840 Ohms  IFI: No  The VNS unit was interrogated, and none of the settings were changed as above. The patient appeared to tolerate the VNS well.  Marlan Palau MD 11/06/2012 9:42 AM  [facility] [address] [place], [place] [address]  Phone [phone] Fax [phone]

## 2012-11-10 ENCOUNTER — Telehealth: Payer: Self-pay | Admitting: Neurology

## 2012-11-10 NOTE — Telephone Encounter (Signed)
Spoke to patient and he said that he went to [facility] and his insurance didn't go through and he would have to pay for flu shot.  He thought if the doctor wrote him a prescription maybe it would be covered.  He asked that the doctor call him. [phone]

## 2012-11-10 NOTE — Telephone Encounter (Signed)
I called patient. The patient wants a prescription for the flu shot. I'll write this.

## 2012-12-10 ENCOUNTER — Other Ambulatory Visit: Payer: Self-pay | Admitting: Neurology

## 2012-12-15 ENCOUNTER — Other Ambulatory Visit: Payer: Self-pay | Admitting: Neurology

## 2012-12-17 ENCOUNTER — Other Ambulatory Visit: Payer: Self-pay

## 2013-01-09 ENCOUNTER — Other Ambulatory Visit: Payer: Self-pay

## 2013-01-09 MED ORDER — LITHIUM CARBONATE 300 MG PO CAPS: 300.0000 mg | ORAL_CAPSULE | Freq: Three times a day (TID) | ORAL | Status: DC

## 2013-01-09 NOTE — Telephone Encounter (Signed)
Pharmacy requests 90 day Rx  

## 2013-01-11 ENCOUNTER — Other Ambulatory Visit: Payer: Self-pay | Admitting: Neurology

## 2013-01-17 ENCOUNTER — Emergency Department (HOSPITAL_COMMUNITY)
Admission: EM | Admit: 2013-01-17 | Discharge: 2013-01-17 | Disposition: A | Discharge status: Home / Self Care | Payer: Medicare Other | Attending: Emergency Medicine | Admitting: Emergency Medicine

## 2013-01-17 ENCOUNTER — Other Ambulatory Visit: Payer: Self-pay

## 2013-01-17 ENCOUNTER — Encounter (HOSPITAL_COMMUNITY): Payer: Self-pay | Admitting: Emergency Medicine

## 2013-01-17 DIAGNOSIS — F411 Generalized anxiety disorder: Secondary | ICD-10-CM | POA: Diagnosis not present

## 2013-01-17 DIAGNOSIS — F319 Bipolar disorder, unspecified: Secondary | ICD-10-CM | POA: Diagnosis not present

## 2013-01-17 DIAGNOSIS — G40909 Epilepsy, unspecified, not intractable, without status epilepticus: Secondary | ICD-10-CM | POA: Insufficient documentation

## 2013-01-17 DIAGNOSIS — G2581 Restless legs syndrome: Secondary | ICD-10-CM | POA: Insufficient documentation

## 2013-01-17 DIAGNOSIS — E669 Obesity, unspecified: Secondary | ICD-10-CM | POA: Insufficient documentation

## 2013-01-17 DIAGNOSIS — R569 Unspecified convulsions: Secondary | ICD-10-CM | POA: Diagnosis not present

## 2013-01-17 DIAGNOSIS — I10 Essential (primary) hypertension: Secondary | ICD-10-CM | POA: Insufficient documentation

## 2013-01-17 LAB — RAPID URINE DRUG SCREEN, HOSP PERFORMED
Amphetamines: NOT DETECTED
Opiates: NOT DETECTED
Tetrahydrocannabinol: NOT DETECTED

## 2013-01-17 LAB — CBC WITH DIFFERENTIAL/PLATELET
Basophils Relative: 0 % (ref 0–1)
Eosinophils Absolute: 0.1 10*3/uL (ref 0.0–0.7)
Eosinophils Relative: 1 % (ref 0–5)
Hemoglobin: 14.8 g/dL (ref 13.0–17.0)
Lymphs Abs: 1.4 10*3/uL (ref 0.7–4.0)
MCH: 32.4 pg (ref 26.0–34.0)
MCHC: 35.7 g/dL (ref 30.0–36.0)
MCV: 90.6 fL (ref 78.0–100.0)
Monocytes Relative: 4 % (ref 3–12)
Neutrophils Relative %: 78 % — ABNORMAL HIGH (ref 43–77)
RDW: 12.1 % (ref 11.5–15.5)

## 2013-01-17 LAB — URINALYSIS, ROUTINE W REFLEX MICROSCOPIC
Bilirubin Urine: NEGATIVE
Glucose, UA: 500 mg/dL — AB
Hgb urine dipstick: NEGATIVE
Ketones, ur: NEGATIVE mg/dL
Leukocytes, UA: NEGATIVE
Nitrite: NEGATIVE
Protein, ur: 100 mg/dL — AB
Urobilinogen, UA: 0.2 mg/dL (ref 0.0–1.0)

## 2013-01-17 LAB — BASIC METABOLIC PANEL
BUN: 12 mg/dL (ref 6–23)
Calcium: 9.8 mg/dL (ref 8.4–10.5)
Creatinine, Ser: 0.63 mg/dL (ref 0.50–1.35)
GFR calc Af Amer: >90 mL/min (ref >90)
GFR calc non Af Amer: >90 mL/min (ref >90)
Glucose, Bld: 237 mg/dL — ABNORMAL HIGH (ref 70–99)
Potassium: 3.9 mEq/L (ref 3.5–5.1)

## 2013-01-17 LAB — PHENYTOIN LEVEL, TOTAL: Phenytoin Lvl: 14.7 ug/mL (ref 10.0–20.0)

## 2013-01-17 LAB — URINE MICROSCOPIC-ADD ON

## 2013-01-17 MED ORDER — SODIUM CHLORIDE 0.9 % IV BOLUS (SEPSIS)
1000.0000 mL | Freq: Once | INTRAVENOUS | Status: AC
  Administered 2013-01-17: 1000 mL via INTRAVENOUS

## 2013-01-17 MED ORDER — LORAZEPAM 2 MG/ML IJ SOLN: 2.0000 mg | Freq: Once | INTRAMUSCULAR | Status: DC

## 2013-01-17 MED ORDER — LORAZEPAM 2 MG/ML IJ SOLN
INTRAMUSCULAR | Status: AC
  Filled 2013-01-17: qty 1

## 2013-01-17 MED ORDER — LORAZEPAM 2 MG/ML IJ SOLN
INTRAMUSCULAR | Status: AC
  Administered 2013-01-17: 2 mg
  Filled 2013-01-17: qty 1

## 2013-01-17 NOTE — ED Notes (Signed)
Md at bedside

## 2013-01-17 NOTE — ED Notes (Addendum)
Md at  [place] at bedside. Pt had seziure Pt bit tongue, pt suctioned.

## 2013-01-17 NOTE — ED Notes (Signed)
Bed: WA07 Expected date:  Expected time:  Means of arrival:  Comments: [id number]?HTN-med noncompliant

## 2013-01-17 NOTE — ED Notes (Addendum)
Pt taken off non-rebreather and put on 4L [place].  Sat 92%.    Pt aware that we need urine.  Unable to provide a sample at this time.

## 2013-01-17 NOTE — Discharge Instructions (Signed)
Keep taking meds as prescribed. Return to the ER if there is another seizure. Have someone watching you for now. See your Neurologist on Monday.   Epilepsy A seizure (convulsion) is a sudden change in brain function that causes a change in behavior, muscle activity, or ability to remain awake and alert. If a person has recurring seizures, this is called epilepsy. CAUSES  Epilepsy is a disorder with many possible causes. Anything that disturbs the normal pattern of brain cell activity can lead to seizures. Seizure can be caused from illness to brain damage to abnormal brain development. Epilepsy may develop because of:  An abnormality in brain wiring.  An imbalance of nerve signaling chemicals (neurotransmitters).  Some combination of these factors. Scientists are learning an increasing amount about genetic causes of seizures. SYMPTOMS  The symptoms of a seizure can vary greatly from one person to another. These may include:  An aura, or warning that tells a person they are about to have a seizure.  Abnormal sensations, such as abnormal smell or seeing flashing lights.  Sudden, general body stiffness.  Rhythmic jerking of the face, arm, or leg  on one or both sides.  Sudden change in consciousness.  The person may appear to be awake but not responding.  They may appear to be asleep but cannot be awakened.  Grimacing, chewing, lip smacking, or drooling.  Often there is a period of sleepiness after a seizure. DIAGNOSIS  The description you give to your caregiver about what you experienced will help them understand your problems. Equally important is the description by any witnesses to your seizure. A physical exam, including a detailed neurological exam, is necessary. An EEG (electroencephalogram) is a painless test of your brain waves. In this test a diagram is created of your brain waves. These diagrams can be interpreted by a specialist. Pictures of your brain are usually taken  with:  An MRI.  A CT scan. Lab tests may be done to look for:  Signs of infection.  Abnormal blood chemistry. PREVENTION  There is no way to prevent the development of epilepsy. If you have seizures that are typically triggered by an event (such as flashing lights), try to avoid the trigger. This can help you avoid a seizure.  PROGNOSIS  Most people with epilepsy lead outwardly normal lives. While epilepsy cannot currently be cured, for some people it does eventually go away. Most seizures do not cause brain damage. It is not uncommon for people with epilepsy, especially children, to develop behavioral and emotional problems. These problems are sometimes the consequence of medicine for seizures or social stress. For some people with epilepsy, the risk of seizures restricts their independence and recreational activities. For example, some states refuse drivers licenses to people with epilepsy. Most women with epilepsy can become pregnant. They should discuss their epilepsy and the medicine they are taking with their caregivers. Women with epilepsy have a 90 percent or better chance of having a normal, healthy baby. RISKS AND COMPLICATIONS  People with epilepsy are at increased risk of falls, accidents, and injuries. People with epilepsy are at special risk for two life-threatening conditions. These are status epilepticus and sudden unexplained death (extremely rare). Status epilepticus is a long lasting, continuous seizure that is a medical emergency. TREATMENT  Once epilepsy is diagnosed, it is important to begin treatment as soon as possible. For about 80 percent of those diagnosed with epilepsy, seizures can be controlled with modern medicines and surgical techniques. Some antiepileptic drugs can  interfere with the effectiveness of oral contraceptives. In 1997, the FDA approved a pacemaker for the brain the (vagus nerve stimulator). This stimulator can be used for people with seizures that are  not well-controlled by medicine. Studies have shown that in some cases, children may experience fewer seizures if they maintain a strict diet. The strict diet is called the ketogenic diet. This diet is rich in fats and low in carbohydrates. HOME CARE INSTRUCTIONS   Your caregiver will make recommendations about driving and safety in normal activities. Follow these carefully.  Take any medicine prescribed exactly as directed.  Do any blood tests requested to monitor the levels of your medicine.  The people you live and work with should know that you are prone to seizures. They should receive instructions on how to help you. In general, a witness to a seizure should:  Cushion your head and body.  Turn you on your side.  Avoid unnecessarily restraining you.  Not place anything inside your mouth.  Call for local emergency medical help if there is any question about what has occurred.  Keep a seizure diary. Record what you recall about any seizure, especially any possible trigger.  If your caregiver has given you a follow-up appointment, it is very important to keep that appointment. Not keeping the appointment could result in permanent injury and disability. If there is any problem keeping the appointment, you must call back to this facility for assistance. SEEK MEDICAL CARE IF:   You develop signs of infection or other illness. This might increase the risk of a seizure.  You seem to be having more frequent seizures.  Your seizure pattern is changing. SEEK IMMEDIATE MEDICAL CARE IF:   A seizure does not stop after a few moments.  A seizure causes any difficulty in breathing.  A seizure results in a very severe headache.  A seizure leaves you with the inability to speak or use a part of your body. MAKE SURE YOU:   Understand these instructions.  Will watch your condition.  Will get help right away if you are not doing well or get worse. Document Released: 01/29/2005 Document  Revised: 04/23/2011 Document Reviewed: 09/10/2012 [facility] Patient Information 2014 [place], [place].  Seizure, Adult A seizure is abnormal electrical activity in the brain. Seizures can cause a change in attention or behavior (altered mental status). Seizures often involve uncontrollable shaking (convulsions). Seizures usually last from 30 seconds to 2 minutes. Epilepsy is a brain disorder in which a patient has repeated seizures over time. CAUSES  There are many different problems that can cause seizures. In some cases, no cause is found. Common causes of seizures include:  Head injuries.  Brain tumors.  Infections.  Imbalance of chemicals in the blood.  Kidney failure or liver failure.  Heart disease.  Drug abuse.  Stroke.  Withdrawal from certain drugs or alcohol.  Birth defects.  Malfunction of a neurosurgical device placed in the brain. SYMPTOMS  Symptoms vary depending on the part of the brain that is involved. Right before a seizure, you may have a warning (aura) that a seizure is about to occur. An aura may include the following symptoms:   Fear or anxiety.  Nausea.  Feeling like the room is spinning (vertigo).  Vision changes, such as seeing flashing lights or spots. Common symptoms during a seizure include:  Convulsions.  Drooling.  Rapid eye movements.  Grunting.  Loss of bladder and bowel control.  Bitter taste in the mouth. After a seizure,  you may feel confused and sleepy. You may also have an injury resulting from convulsions during the seizure. DIAGNOSIS  Your caregiver will perform a physical exam and run some tests to determine the type and cause of your seizure. These tests may include:  Blood tests.  A lumbar puncture test. In this test, a small amount of fluid is removed from the spine and examined.  Electrocardiography (ECG). This test records the electrical activity in your heart.  Imaging tests, such as computed tomography (CT)  scans or magnetic resonance imaging (MRI).  Electroencephalography (EEG). This test records the electrical activity in your brain. TREATMENT  Seizures usually stop on their own. Treatment will depend on the cause of your seizure. In some cases, medicine may be given to prevent future seizures. HOME CARE INSTRUCTIONS   If you are given medicines, take them exactly as prescribed by your caregiver.  Keep all follow-up appointments as directed by your caregiver.  Do not swim or drive until your caregiver says it is okay.  Teach friends and family what to do if you have a seizure. They should:  Lay you on the ground to prevent a fall.  Put a cushion under your head.  Loosen any tight clothing around your neck.  Turn you on your side. If vomiting occurs, this helps keep your airway clear.  Stay with you until you recover. SEEK IMMEDIATE MEDICAL CARE IF:  The seizure lasts longer than 2 to 5 minutes.  The seizure is severe or the person does not wake up after the seizure.  The person has altered mental status. Drive the person to the emergency department or call your local emergency services (911 in [place] MAKE SURE YOU:  Understand these instructions.  Will watch your condition.  Will get help right away if you are not doing well or get worse. Document Released: 01/27/2000 Document Revised: 04/23/2011 Document Reviewed: 01/17/2011 [facility] Patient Information 2014 [place], [place].  Driving and Equipment Restrictions Some medical problems make it dangerous to drive, ride a bike, or use machines. Some of these problems are:  A hard blow to the head (concussion).  Passing out (fainting).  Twitching and shaking (seizures).  Low blood sugar.  Taking medicine to help you relax (sedatives).  Taking pain medicines.  Wearing an eye patch.  Wearing splints. This can make it hard to use parts of your body that you need to drive safely. HOME CARE   Do not drive until your  doctor says it is okay.  Do not use machines until your doctor says it is okay. You may need a form signed by your doctor (medical release) before you can drive again. You may also need this form before you do other tasks where you need to be fully alert. MAKE SURE YOU:  Understand these instructions.  Will watch your condition.  Will get help right away if you are not doing well or get worse. Document Released: 03/08/2004 Document Revised: 04/23/2011 Document Reviewed: 06/08/2009 [facility] Patient Information 2014 [place], [place].

## 2013-01-17 NOTE — ED Notes (Signed)
Pt is asking to speak w/ MD.  Rhunette Croft, MD notified.

## 2013-01-17 NOTE — ED Notes (Signed)
Pt reports that he did not have a seizure but has had jerking. He did not take any medication yesterday. He doubled up on Dilantin today to help with the jerking.

## 2013-01-17 NOTE — ED Notes (Signed)
Pt was talking to mother on the phone and asked me to speak with her.  The mother reports that the Pt lies about taking his medication.  She sts "I don't think he has taken his medication in at least two days."

## 2013-01-17 NOTE — ED Notes (Signed)
Non compliance with Seizure medications. The patient has not taken in 2 days, he took 5 of the dilantin today. BP high at 160/90. CBG 250 non diabetic.

## 2013-01-18 NOTE — ED Provider Notes (Signed)
CSN: [id number]     Arrival date & time 01/17/13  1140 History   First MD Initiated Contact with Patient 01/17/13 1152     Chief Complaint  Patient presents with  . Seizures   (Consider location/radiation/quality/duration/timing/severity/associated sxs/prior Treatment) HPI Comments: Pt with hx of seizures comes in with cc of shaking. States that he missed all his meds y'day, but is compliant otherwise. Today, he has been having some "jerks" every 30 seconds, diffuse, without having a full tonic clonic seizing. He is on lithium, denies taking too much of that. No recent infection, and he denies n/v/f/c/headache/neck pain or stiffness. No hx of similar movement. Not on any antipsychotics.  Patient is a 37 y.o. male presenting with seizures. The history is provided by the patient.  Seizures   Past Medical History  Diagnosis Date  . Hypertension   . Mental disorder     border line bipolar  . Anxiety   . Depression   . Mild obesity   . Seizures     Intractible  . Suicide and self-inflicted injury   . Restless legs syndrome (RLS) 11/06/2012    Right leg   Past Surgical History  Procedure Laterality Date  . Burn debridement surgery    . Vagus nerve stimulator insertion    . Joint replacement      rotator cuff surgery   lt  . Direct laryngoscopy with radiaesse injection  04/11/2011    Procedure: DIRECT LARYNGOSCOPY WITH RADIAESSE INJECTION;  Surgeon: Christia Reading, MD;  Location: [facility] OR;  Service: ENT;  Laterality: N/A;  MICRO DIRECT LARYNGOSCOPY WITH LEFT VOCAL FOLD RADIESSE INJECTION/JET VENTURI VENTILATION  . Shoulder surgery N/A    Family History  Problem Relation Age of Onset  . Diabetes Mother   . ALS Maternal Aunt   . Cancer Maternal Uncle    History  Substance Use Topics  . Smoking status: Never Smoker   . Smokeless tobacco: Never Used  . Alcohol Use: No    Review of Systems  Constitutional: Negative for fever, chills, activity change and fatigue.  Eyes: Negative for  visual disturbance.  Respiratory: Negative for cough, chest tightness and shortness of breath.   Cardiovascular: Negative for chest pain.  Gastrointestinal: Negative for abdominal distention.  Genitourinary: Negative for dysuria, enuresis and difficulty urinating.  Musculoskeletal: Negative for arthralgias and neck pain.  Neurological: Positive for tremors. Negative for dizziness, seizures, syncope, speech difficulty, weakness, light-headedness and headaches.  Hematological: Does not bruise/bleed easily.  Psychiatric/Behavioral: Negative for confusion.    Allergies  Review of patient's allergies indicates no known allergies.  Home Medications   Current Outpatient Rx  Name  Route  Sig  Dispense  Refill  . amLODipine (NORVASC) 5 MG tablet   Oral   Take 5 mg by mouth daily.         . cholecalciferol (VITAMIN D) 1000 UNITS tablet   Oral   Take 1,000 Units by mouth daily.         Marland Kitchen escitalopram (LEXAPRO) 20 MG tablet   Oral   Take 20 mg by mouth daily.         Marland Kitchen lacosamide (VIMPAT) 200 MG TABS tablet   Oral   Take 200 mg by mouth 2 (two) times daily.         Marland Kitchen lamoTRIgine (LAMICTAL) 200 MG tablet   Oral   Take 400 mg by mouth 2 (two) times daily.         Marland Kitchen lisinopril (PRINIVIL,ZESTRIL) 10 MG  tablet   Oral   Take 10 mg by mouth daily.         Marland Kitchen lithium carbonate 300 MG capsule   Oral   Take 1 capsule (300 mg total) by mouth 3 (three) times daily with meals.   270 capsule   1   . phenytoin (DILANTIN) 100 MG ER capsule   Oral   Take 200-300 mg by mouth 2 (two) times daily. 200 mg AM dose take with 60 mg 300mg  PM dose         . phenytoin (DILANTIN) 30 MG ER capsule   Oral   Take 60 mg by mouth every morning. Take with 200mg          . venlafaxine (EFFEXOR-XR) 75 MG 24 hr capsule   Oral   Take 225 mg by mouth daily.          BP 125/73  Pulse 92  Temp(Src) 98.7 F (37.1 C) (Oral)  Resp 18  SpO2 96% Physical Exam  Nursing note and vitals  reviewed. Constitutional: He is oriented to person, place, and time. He appears well-developed.  HENT:  Head: Normocephalic and atraumatic.  Eyes: Conjunctivae and EOM are normal. Pupils are equal, round, and reactive to light.  Neck: Normal range of motion. Neck supple.  Cardiovascular: Normal rate and regular rhythm.   Pulmonary/Chest: Effort normal and breath sounds normal.  Abdominal: Soft. Bowel sounds are normal. He exhibits no distension. There is no tenderness. There is no rebound and no guarding.  Neurological: He is alert and oriented to person, place, and time.  Cerebellar exam is normal (finger to nose) Sensory exam normal for bilateral upper and lower extremities - and patient is able to discriminate between sharp and dull. Motor exam is 4+/5  Pt is however noted to have a tremor, bilateral, which also affects his speech. The tremor last for 1 second at most and is recurrent, q 1 minute.  Skin: Skin is warm.    ED Course  Procedures (including critical care time) Labs Review Labs Reviewed  CBC WITH DIFFERENTIAL - Abnormal; Notable for the following:    Neutrophils Relative % 78 (*)    All other components within normal limits  BASIC METABOLIC PANEL - Abnormal; Notable for the following:    Sodium 133 (*)    Glucose, Bld 237 (*)    All other components within normal limits  LITHIUM LEVEL - Abnormal; Notable for the following:    Lithium Lvl <0.25 (*)    All other components within normal limits  URINALYSIS, ROUTINE W REFLEX MICROSCOPIC - Abnormal; Notable for the following:    APPearance CLOUDY (*)    Glucose, UA 500 (*)    Protein, ur 100 (*)    All other components within normal limits  PHENYTOIN LEVEL, TOTAL  URINE RAPID DRUG SCREEN (HOSP PERFORMED)  URINE MICROSCOPIC-ADD ON   Imaging Review No results found.  EKG Interpretation   None       MDM   1. Seizure disorder    Pt comes in with cc of "Tremor, jerking."  As soon as i completed the hx, and  ordered labs, patient had a full blown, tonic, clonic seizure - with tongue biting, LOC.  Pt's labs are WNL. He required 2 mg iv ativan when seizing, and the episode continued for about 3 minutes.  Pt responded to baseline overtime.  I spoke with Dr. Leroy Kennedy Neurology - to see if any changes need to be made, and his recs  are NO changes. Pt is already on max dose for both his meds, and he doesn't think we need to start anything new this visit. He feels that the outpatient neurologist is the best person to make the switch.  Pt's shaking ceased post seizure. We observed him for an extended period of time. He was picked up by his mother, and is to stay with his uncle over the weekend, so as to there is someone to keep an eye on his condition. He is to return to the ER if the sx return.  Derwood Kaplan, MD 01/18/13 [phone]

## 2013-01-21 ENCOUNTER — Telehealth: Payer: Self-pay | Admitting: Neurology

## 2013-01-21 NOTE — Telephone Encounter (Signed)
Patient came to office this morning because he had a seizure over the weekend and bit his tongue.  He showed me the bite on the right side of the tongue, and asked for antibiodics from the doctor.  Spoke to Dr. Anne Hahn and he did not feel antibiodics were necessary, but advised that patient go to Urgent Care if tongue didn't improve.  He also asked that we have the NP see patient for a follow up appointment with labs.  I relayed information to patient and he expressed understanding.

## 2013-01-21 NOTE — Telephone Encounter (Signed)
Spoke to patient and he asked to be prescribed something to help him sleep.  He said his tongue throbs and its hard to get rest.    Also confirmed an appointment in January with NP.

## 2013-02-18 ENCOUNTER — Encounter (INDEPENDENT_AMBULATORY_CARE_PROVIDER_SITE_OTHER): Payer: Self-pay

## 2013-02-18 ENCOUNTER — Ambulatory Visit (INDEPENDENT_AMBULATORY_CARE_PROVIDER_SITE_OTHER): Payer: Medicare Other | Admitting: Nurse Practitioner

## 2013-02-18 ENCOUNTER — Encounter: Payer: Self-pay | Admitting: Nurse Practitioner

## 2013-02-18 VITALS — BP 133/77 | HR 81 | Ht 67.0 in

## 2013-02-18 DIAGNOSIS — G2581 Restless legs syndrome: Secondary | ICD-10-CM | POA: Diagnosis not present

## 2013-02-18 DIAGNOSIS — Z5181 Encounter for therapeutic drug level monitoring: Secondary | ICD-10-CM | POA: Diagnosis not present

## 2013-02-18 DIAGNOSIS — G40319 Generalized idiopathic epilepsy and epileptic syndromes, intractable, without status epilepticus: Secondary | ICD-10-CM | POA: Diagnosis not present

## 2013-02-18 MED ORDER — PRAMIPEXOLE DIHYDROCHLORIDE 0.125 MG PO TABS: 0.1250 mg | ORAL_TABLET | Freq: Three times a day (TID) | ORAL | Status: DC

## 2013-02-18 MED ORDER — PRAMIPEXOLE DIHYDROCHLORIDE 0.125 MG PO TABS
0.1250 mg | ORAL_TABLET | Freq: Three times a day (TID) | ORAL | Status: DC
Start: 2013-02-18 — End: 2013-05-15

## 2013-02-18 NOTE — Patient Instructions (Signed)
Continue your current medications. We will check labs today. Dr. Jannifer Franklin will check your Nerve Stimulator at next visit. Follow up with Dr. Jannifer Franklin in 6 months.  Start Mirapex taking 1 tablet at night.  After 2 weeks, you may start taking it in the morning and at night.  After 2 more weeks you may take 1 tablet three times a day (including the bedtime dose) if needed.

## 2013-02-18 NOTE — Progress Notes (Signed)
PATIENT: Eric Fernandez DOB: 1975/09/18   REASON FOR VISIT: follow up for seizures, restless legs HISTORY FROM: patient  HISTORY OF PRESENT ILLNESS: Eric Fernandez is a 38 year old right-handed white male with a history of seizures. The patient has a vagal nerve stimulator in place, and he is on several medications for seizures that include Lamictal, Vimpat, and Dilantin. The patient is tolerating the medications fairly well. The patient is on lithium for bipolar disorder. The patient has been better on this medication. Lithium levels done in March 2014 reveal a level of 0.7. The patient indicates that he has not had any definite seizures his last seen in March. The patient does have episodes of "phasing out". These tend to occur when he is frustrated, or stressed out. The patient may have these episodes daily. The patient does not have episodes when he is active, such as riding his bicycle. The patient also reports some restless leg problems, with the right leg primarily at nighttime.   UPDATE 02/18/13 (LL): The patient returns for an evaluation. He states he had one breakthrough seizure on 01/17/2013 3 was taken [place] long for seizure.  He states that he had missed a dose of his medication.  He is primarily concerned today with restless legs making it difficult for him to sleep at night. He states that the legs jerk so much that he is unable to get to sleep him once he is asleep he wakes him up.  He states the he has decreased energy due to weight gain, although he does not get regular exercise. He asked if we could prescribe him a weight loss pill.  ROS:  Out of a complete 14 system review of symptoms, the patient complains only of the following symptoms, and all other reviewed systems are negative.  Weight gain  Memory loss, confusion, seizures, tremor Depression, anxiety Restless legs   ALLERGIES: No Known Allergies  HOME MEDICATIONS: Outpatient Prescriptions Prior to Visit  Medication  Sig Dispense Refill  . amLODipine (NORVASC) 5 MG tablet Take 5 mg by mouth daily.      . cholecalciferol (VITAMIN D) 1000 UNITS tablet Take 1,000 Units by mouth daily.      Marland Kitchen escitalopram (LEXAPRO) 20 MG tablet Take 20 mg by mouth daily.      Marland Kitchen lacosamide (VIMPAT) 200 MG TABS tablet Take 200 mg by mouth 2 (two) times daily.      Marland Kitchen lamoTRIgine (LAMICTAL) 200 MG tablet Take 400 mg by mouth 2 (two) times daily.      Marland Kitchen lisinopril (PRINIVIL,ZESTRIL) 10 MG tablet Take 10 mg by mouth daily.      Marland Kitchen lithium carbonate 300 MG capsule Take 1 capsule (300 mg total) by mouth 3 (three) times daily with meals.  270 capsule  1  . phenytoin (DILANTIN) 100 MG ER capsule Take 200-300 mg by mouth 2 (two) times daily. 200 mg AM dose take with 60 mg 300mg  PM dose      . phenytoin (DILANTIN) 30 MG ER capsule Take 60 mg by mouth every morning. Take with 200mg       . venlafaxine (EFFEXOR-XR) 75 MG 24 hr capsule Take 225 mg by mouth daily.       No facility-administered medications prior to visit.    PAST MEDICAL HISTORY: Past Medical History  Diagnosis Date  . Hypertension   . Mental disorder     border line bipolar  . Anxiety   . Depression   . Mild obesity   .  Seizures     Intractible  . Suicide and self-inflicted injury   . Restless legs syndrome (RLS) 11/06/2012    Right leg    PAST SURGICAL HISTORY: Past Surgical History  Procedure Laterality Date  . Burn debridement surgery    . Vagus nerve stimulator insertion    . Joint replacement      rotator cuff surgery   lt  . Direct laryngoscopy with radiaesse injection  04/11/2011    Procedure: DIRECT LARYNGOSCOPY WITH RADIAESSE INJECTION;  Surgeon: Melida Quitter, MD;  Location: [place];  Service: ENT;  Laterality: N/A;  MICRO DIRECT LARYNGOSCOPY WITH LEFT VOCAL FOLD RADIESSE INJECTION/JET VENTURI VENTILATION  . Shoulder surgery N/A     FAMILY HISTORY: Family History  Problem Relation Age of Onset  . Diabetes Mother   . ALS Maternal Aunt   . Cancer  Maternal Uncle     SOCIAL HISTORY: History   Social History  . Marital Status: Single    Spouse Name: N/A    Number of Children: 0  . Years of Education: 6th   Occupational History  .     Social History Main Topics  . Smoking status: Never Smoker   . Smokeless tobacco: Never Used  . Alcohol Use: No  . Drug Use: No  . Sexual Activity: No   Other Topics Concern  . Not on file   Social History Narrative  . No narrative on file     PHYSICAL EXAM  Filed Vitals:   02/18/13 1035  BP: 133/77  Pulse: 81  Height: 5\' 7"  (1.702 m)   Body mass index is 0.00 kg/(m^2).  Physical Exam  General: The patient is alert and cooperative at the time of the examination. The patient is moderately obese.  Skin: No significant peripheral edema is noted.  Neurologic Exam  Cranial nerves: Facial symmetry is present. Speech is normal, no aphasia or dysarthria is noted. Extraocular movements are full. Visual fields are full.  Motor: The patient has good strength in all 4 extremities.  Coordination: The patient has good finger-nose-finger and heel-to-shin bilaterally.  Gait and station: The patient has a normal gait. Tandem gait is normal. Romberg is negative. No drift is seen.  Reflexes: Deep tendon reflexes are symmetric.   DIAGNOSTIC DATA (LABS, IMAGING, TESTING) - I reviewed patient records, labs, notes, testing and imaging myself where available.  Lab Results  Component Value Date   WBC 8.5 01/17/2013   HGB 14.8 01/17/2013   HCT 41.4 01/17/2013   MCV 90.6 01/17/2013   PLT 247 01/17/2013      Component Value Date/Time   NA 133* 01/17/2013 1200   NA 141 05/06/2012 0942   K 3.9 01/17/2013 1200   CL 96 01/17/2013 1200   CO2 23 01/17/2013 1200   GLUCOSE 237* 01/17/2013 1200   GLUCOSE 146* 05/06/2012 0942   BUN 12 01/17/2013 1200   BUN 10 05/06/2012 0942   CREATININE 0.63 01/17/2013 1200   CALCIUM 9.8 01/17/2013 1200   PROT 6.6 05/06/2012 0942   PROT 6.8 05/20/2011 1601   ALBUMIN 4.0 05/20/2011  1601   AST 21 05/06/2012 0942   ALT 24 05/06/2012 0942   ALKPHOS 149* 05/06/2012 0942   BILITOT 0.1 05/06/2012 0942   GFRNONAA >90 01/17/2013 1200   GFRAA >90 01/17/2013 1200   ASSESSMENT AND PLAN 38 y.o. year old male  has a past medical history of Hypertension; Mental disorder; Anxiety; Depression; Mild obesity; Seizures; Suicide and self-inflicted injury; and Restless legs syndrome (  RLS) (11/06/2012). here with: 1. History of seizures, 1 breakthrough seizure on 01/17/13. 2. Restless leg syndrome  3. Bipolar disorder  The patient is doing relatively well at this point. We will continue the medications for now, check labs today, he was instructed on starting Mirapex for restless legs. The patient will followup in 6 months. The patient does not have a primary care physician. The vagal nerve stimulator was not interrogated.  Orders Placed This Encounter  Procedures  . CMP  . CBC (no diff)  . Phenytoin Level, Total  . Lamotrigine level   Meds ordered this encounter  Medications  . lamoTRIgine (LAMICTAL) 100 MG tablet    Sig: Take 100 mg by mouth daily.  . pramipexole (MIRAPEX) 0.125 MG tablet    Sig: Take 1 tablet (0.125 mg total) by mouth 3 (three) times daily.    Dispense:  270 tablet    Refill:  3    Start Mirapex taking 1 tablet at night. After 2 weeks, you may start taking it in the morning and at night. After 2 more weeks you may take 1 tablet three times a day (including the bedtime dose) if needed.    Order Specific Question:  Supervising Provider    Answer:  Penni Bombard [[id number]]   Philmore Pali, MSN, NP-C 02/18/2013, 11:21 AM [facility] [address], [place], [place] [address] [phone]  Note: This document was prepared with digital dictation and possible smart phrase technology. Any transcriptional errors that result from this process are unintentional.

## 2013-02-18 NOTE — Progress Notes (Signed)
I have read the note, and I agree with the clinical assessment and plan.  Eric Fernandez,Eric Fernandez   

## 2013-02-19 LAB — COMPREHENSIVE METABOLIC PANEL
ALT: 31 IU/L (ref 0–44)
AST: 19 IU/L (ref 0–40)
Albumin/Globulin Ratio: 2.4 (ref 1.1–2.5)
Albumin: 4.7 g/dL (ref 3.5–5.5)
Alkaline Phosphatase: 149 IU/L — ABNORMAL HIGH (ref 39–117)
BUN/Creatinine Ratio: 13 (ref 8–19)
BUN: 9 mg/dL (ref 6–20)
CO2: 21 mmol/L (ref 18–29)
Calcium: 9.5 mg/dL (ref 8.7–10.2)
Chloride: 97 mmol/L (ref 97–108)
Creatinine, Ser: 0.7 mg/dL — ABNORMAL LOW (ref 0.76–1.27)
GFR calc Af Amer: 140 mL/min/{1.73_m2} (ref >59)
GFR calc non Af Amer: 121 mL/min/{1.73_m2} (ref >59)
Globulin, Total: 2 g/dL (ref 1.5–4.5)
Glucose: 274 mg/dL — ABNORMAL HIGH (ref 65–99)
Potassium: 4.3 mmol/L (ref 3.5–5.2)
Sodium: 138 mmol/L (ref 134–144)
Total Bilirubin: 0.2 mg/dL (ref 0.0–1.2)
Total Protein: 6.7 g/dL (ref 6.0–8.5)

## 2013-02-19 LAB — LAMOTRIGINE LEVEL: Lamotrigine Lvl: 10.8 ug/mL (ref 2.0–20.0)

## 2013-02-19 LAB — CBC
HCT: 40.7 % (ref 37.5–51.0)
Hemoglobin: 14.2 g/dL (ref 12.6–17.7)
MCH: 32.1 pg (ref 26.6–33.0)
MCHC: 34.9 g/dL (ref 31.5–35.7)
MCV: 92 fL (ref 79–97)
Platelets: 277 10*3/uL (ref 150–379)
RBC: 4.43 x10E6/uL (ref 4.14–5.80)
RDW: 12.8 % (ref 12.3–15.4)
WBC: 8.5 10*3/uL (ref 3.4–10.8)

## 2013-02-19 LAB — PHENYTOIN LEVEL, TOTAL: Phenytoin Lvl: 12.8 ug/mL (ref 10.0–20.0)

## 2013-05-12 ENCOUNTER — Other Ambulatory Visit: Payer: Self-pay | Admitting: Neurology

## 2013-05-12 MED ORDER — LAMOTRIGINE 200 MG PO TABS: 400.0000 mg | ORAL_TABLET | Freq: Two times a day (BID) | ORAL | Status: DC

## 2013-05-12 NOTE — Telephone Encounter (Signed)
Rx signed and faxed.

## 2013-05-15 ENCOUNTER — Encounter: Payer: Self-pay | Admitting: Neurology

## 2013-05-15 ENCOUNTER — Encounter (INDEPENDENT_AMBULATORY_CARE_PROVIDER_SITE_OTHER): Payer: Self-pay

## 2013-05-15 ENCOUNTER — Ambulatory Visit (INDEPENDENT_AMBULATORY_CARE_PROVIDER_SITE_OTHER): Payer: Medicare Other | Admitting: Neurology

## 2013-05-15 VITALS — BP 134/84 | HR 73

## 2013-05-15 DIAGNOSIS — G2581 Restless legs syndrome: Secondary | ICD-10-CM

## 2013-05-15 DIAGNOSIS — G40319 Generalized idiopathic epilepsy and epileptic syndromes, intractable, without status epilepticus: Secondary | ICD-10-CM | POA: Diagnosis not present

## 2013-05-15 DIAGNOSIS — Z5181 Encounter for therapeutic drug level monitoring: Secondary | ICD-10-CM

## 2013-05-15 MED ORDER — PRAMIPEXOLE DIHYDROCHLORIDE 0.75 MG PO TABS: 0.7500 mg | ORAL_TABLET | Freq: Every day | ORAL | Status: DC

## 2013-05-15 NOTE — Progress Notes (Signed)
Reason for visit: Seizures  Eric Fernandez is an 38 y.o. male  History of present illness:  Eric Fernandez is a 38 year old right-handed white male with a history of intractable seizures. The patient is on 3 different anticonvulsant medications that include Vimpat, Dilantin, and Lamictal. The patient is tolerating these medications fairly well, but he continues to have daily events of brief staring episodes lasting a few seconds. The patient indicates that he has done quite well with his generalized seizures, and he has not had any events since last seen in January 2015. The patient has a vagal nerve stimulator in place, and he is tolerating this well. The patient believes that the vagal nerve stimulator has helped him some. The patient has some problems with periodic limb movements at night, and he is on Lexapro. The patient takes Mirapex for this, currently taking 5 of the 0.125 mg tablets at night. The patient indicates that this is not completely effective. The patient does not wake up with the periodic limb movements once he gets to sleep.  Past Medical History  Diagnosis Date  . Hypertension   . Mental disorder     border line bipolar  . Anxiety   . Depression   . Mild obesity   . Seizures     Intractible  . Suicide and self-inflicted injury   . Restless legs syndrome (RLS) 11/06/2012    Right leg    Past Surgical History  Procedure Laterality Date  . Burn debridement surgery    . Vagus nerve stimulator insertion    . Joint replacement      rotator cuff surgery   lt  . Direct laryngoscopy with radiaesse injection  04/11/2011    Procedure: DIRECT LARYNGOSCOPY WITH RADIAESSE INJECTION;  Surgeon: Melida Quitter, MD;  Location: [place];  Service: ENT;  Laterality: N/A;  MICRO DIRECT LARYNGOSCOPY WITH LEFT VOCAL FOLD RADIESSE INJECTION/JET VENTURI VENTILATION  . Shoulder surgery N/A     Family History  Problem Relation Age of Onset  . Diabetes Mother   . ALS Maternal Aunt   . Cancer  Maternal Uncle     Social history:  reports that he has never smoked. He has never used smokeless tobacco. He reports that he does not drink alcohol or use illicit drugs.   No Known Allergies  Medications:  Current Outpatient Prescriptions on File Prior to Visit  Medication Sig Dispense Refill  . amLODipine (NORVASC) 5 MG tablet Take 5 mg by mouth daily.      . cholecalciferol (VITAMIN D) 1000 UNITS tablet Take 1,000 Units by mouth daily.      Marland Kitchen escitalopram (LEXAPRO) 20 MG tablet Take 20 mg by mouth daily.      Marland Kitchen LAMICTAL 100 MG tablet TAKE 1 TABLET BY MOUTH TWICE A DAY  180 tablet  1  . lamoTRIgine (LAMICTAL) 200 MG tablet Take 2 tablets (400 mg total) by mouth 2 (two) times daily.  360 tablet  1  . lisinopril (PRINIVIL,ZESTRIL) 10 MG tablet Take 10 mg by mouth daily.      Marland Kitchen lithium carbonate 300 MG capsule Take 1 capsule (300 mg total) by mouth 3 (three) times daily with meals.  270 capsule  1  . phenytoin (DILANTIN) 100 MG ER capsule Take 200-300 mg by mouth 2 (two) times daily. 200 mg AM dose take with 60 mg 300mg  PM dose      . phenytoin (DILANTIN) 30 MG ER capsule Take 60 mg by mouth every morning.  Take with 200mg       . venlafaxine (EFFEXOR-XR) 75 MG 24 hr capsule Take 225 mg by mouth daily.      Marland Kitchen VIMPAT 200 MG TABS tablet TAKE 1 TABLET BY MOUTH TWICE A DAY  180 tablet  1   No current facility-administered medications on file prior to visit.    ROS:  Out of a complete 14 system review of symptoms, the patient complains only of the following symptoms, and all other reviewed systems are negative.   Blood pressure 134/84, pulse 73, height 0' (0 m), weight 0 lb (0 kg).  Physical Exam  General: The patient is alert and cooperative at the time of the examination. The patient is moderately obese.  Skin: No significant peripheral edema is noted.   Neurologic Exam  Mental status: The patient is oriented x 3.  Cranial nerves: Facial symmetry is present. Speech is normal, no  aphasia or dysarthria is noted. Extraocular movements are full. Visual fields are full.  Motor: The patient has good strength in all 4 extremities.  Sensory examination: Soft touch sensation is symmetric on the face, arms, and legs.  Coordination: The patient has good finger-nose-finger and heel-to-shin bilaterally.  Gait and station: The patient has a normal gait. Tandem gait is normal. Romberg is negative. No drift is seen.  Reflexes: Deep tendon reflexes are symmetric.   Assessment/Plan:  One. Intractable seizure disorder  2. Bipolar disorder  3. Periodic limb movements  The patient will be increased on the Mirapex, taking a 0.75 mg tablet night. The patient will continue his anticonvulsant medications, and blood work will be done today. The vagal nerve stimulator was readjusted, with the cycle time increased to 1.1 minutes. The patient will followup in 6 months. The patient is not operating a motor vehicle.  Eric Alexanders MD 05/15/2013 8:33 PM  [facility] [address] [place], [place] [address]  Phone [phone] Fax [phone]

## 2013-05-15 NOTE — Procedures (Signed)
       Mr. Eric Fernandez is a 38 year old gentleman with a history of  intractable seizures. The patient has had a vagal nerve  stimulator placed, and he reports no definite seizures since  March of 2014. The patient is having his vagal nerve stimulator  settings reinterrogated.  VAGAL NERVE STIMULATOR SETTINGS:  Output current: 150 milliamps  Signal frequency: 30 Hz  Pulse width: 250 microseconds  On time: 30 seconds  Off time: 1.8 minutes changed to 1.1 minutes  Magnet current: 2.0 milliamps  Magnet on time: 60 seconds  Pulse width: 250 microseconds  Activations: 109  Output current: 1.5 milliamps  Lead impedance: 2895 Ohms  IFI: No  The VNS unit was interrogated, and none of the settings were  changed as above. The patient appeared to tolerate the VNS well.

## 2013-05-15 NOTE — Patient Instructions (Signed)
Epilepsy Epilepsy is a disorder in which a person has repeated seizures over time. A seizure is a release of abnormal electrical activity in the brain. Seizures can cause a change in attention, behavior, or the ability to remain awake and alert (altered mental status). Seizures often involve uncontrollable shaking (convulsions).  Most people with epilepsy lead normal lives. However, people with epilepsy are at an increased risk of falls, accidents, and injuries. Therefore, it is important to begin treatment right away. CAUSES  Epilepsy has many possible causes. Anything that disturbs the normal pattern of brain cell activity can lead to seizures. This may include:   Head injury.  Birth trauma.  High fever as a child.  Stroke.  Bleeding into or around the brain.  Certain drugs.  Prolonged low oxygen, such as what occurs after CPR efforts.  Abnormal brain development.  Certain illnesses, such as meningitis, encephalitis (brain infection), malaria, and other infections.  An imbalance of nerve signaling chemicals (neurotransmitters).  SIGNS AND SYMPTOMS  The symptoms of a seizure can vary greatly from one person to another. Right before a seizure, you may have a warning (aura) that a seizure is about to occur. An aura may include the following symptoms:  Fear or anxiety.  Nausea.  Feeling like the room is spinning (vertigo).  Vision changes, such as seeing flashing lights or spots. Common symptoms during a seizure include:  Abnormal sensations, such as an abnormal smell or a bitter taste in the mouth.   Sudden, general body stiffness.   Convulsions that involve rhythmic jerking of the face, arm, or leg on one or both sides.   Sudden change in consciousness.   Appearing to be awake but not responding.   Appearing to be asleep but cannot be awakened.   Grimacing, chewing, lip smacking, drooling, tongue biting, or loss of bowel or bladder control. After a seizure,  you may feel sleepy for a while. DIAGNOSIS  Your health care provider will ask about your symptoms and take a medical history. Descriptions from any witnesses to your seizures will be very helpful in the diagnosis. A physical exam, including a detailed neurological exam, is necessary. Various tests may be done, such as:   An electroencephalogram (EEG). This is a painless test of your brain waves. In this test, a diagram is created of your brain waves. These diagrams can be interpreted by a specialist.  An MRI of the brain.   A CT scan of the brain.   A spinal tap (lumbar puncture, LP).  Blood tests to check for signs of infection or abnormal blood chemistry. TREATMENT  There is no cure for epilepsy, but it is generally treatable. Once epilepsy is diagnosed, it is important to begin treatment as soon as possible. For most people with epilepsy, seizures can be controlled with medicines. The following may also be used:  A pacemaker for the brain (vagus nerve stimulator) can be used for people with seizures that are not well controlled by medicine.  Surgery on the brain. For some people, epilepsy eventually goes away. HOME CARE INSTRUCTIONS   Follow your health care provider's recommendations on driving and safety in normal activities.  Get enough rest. Lack of sleep can cause seizures.  Only take over-the-counter or prescription medicines as directed by your health care provider. Take any prescribed medicine exactly as directed.  Avoid any known triggers of your seizures.  Keep a seizure diary. Record what you recall about any seizure, especially any possible trigger.   Make   sure the people you live and work with know that you are prone to seizures. They should receive instructions on how to help you. In general, a witness to a seizure should:   Cushion your head and body.   Turn you on your side.   Avoid unnecessarily restraining you.   Not place anything inside your  mouth.   Call for emergency medical help if there is any question about what has occurred.   Follow up with your health care provider as directed. You may need regular blood tests to monitor the levels of your medicine.  SEEK MEDICAL CARE IF:   You develop signs of infection or other illness. This might increase the risk of a seizure.   You seem to be having more frequent seizures.   Your seizure pattern is changing.  SEEK IMMEDIATE MEDICAL CARE IF:   You have a seizure that does not stop after a few moments.   You have a seizure that causes any difficulty in breathing.   You have a seizure that results in a very severe headache.   You have a seizure that leaves you with the inability to speak or use a part of your body.  Document Released: 01/29/2005 Document Revised: 11/19/2012 Document Reviewed: 09/10/2012 Exit[employer] Patient Information 2014 [employer].  

## 2013-05-19 ENCOUNTER — Telehealth: Payer: Self-pay | Admitting: Neurology

## 2013-05-19 LAB — COMPREHENSIVE METABOLIC PANEL
ALT: 25 IU/L (ref 0–44)
AST: 27 IU/L (ref 0–40)
Albumin/Globulin Ratio: 2.4 (ref 1.1–2.5)
Albumin: 4.7 g/dL (ref 3.5–5.5)
Alkaline Phosphatase: 153 IU/L — ABNORMAL HIGH (ref 39–117)
BUN / CREAT RATIO: 15 (ref 8–19)
BUN: 11 mg/dL (ref 6–20)
CHLORIDE: 93 mmol/L — AB (ref 97–108)
CO2: 19 mmol/L (ref 18–29)
Calcium: 9.3 mg/dL (ref 8.7–10.2)
Creatinine, Ser: 0.72 mg/dL — ABNORMAL LOW (ref 0.76–1.27)
GFR calc Af Amer: 138 mL/min/{1.73_m2} (ref >59)
GFR calc non Af Amer: 119 mL/min/{1.73_m2} (ref >59)
GLOBULIN, TOTAL: 2 g/dL (ref 1.5–4.5)
Glucose: 363 mg/dL — ABNORMAL HIGH (ref 65–99)
Potassium: 4.7 mmol/L (ref 3.5–5.2)
SODIUM: 135 mmol/L (ref 134–144)
Total Bilirubin: <0.2 mg/dL (ref 0.0–1.2)
Total Protein: 6.7 g/dL (ref 6.0–8.5)

## 2013-05-19 LAB — CBC WITH DIFFERENTIAL
BASOS ABS: 0 10*3/uL (ref 0.0–0.2)
Basos: 0 %
EOS: 1 %
Eosinophils Absolute: 0.1 10*3/uL (ref 0.0–0.4)
HCT: 45.3 % (ref 37.5–51.0)
Hemoglobin: 15.6 g/dL (ref 12.6–17.7)
IMMATURE GRANS (ABS): 0 10*3/uL (ref 0.0–0.1)
Immature Granulocytes: 0 %
Lymphocytes Absolute: 1.5 10*3/uL (ref 0.7–3.1)
Lymphs: 20 %
MCH: 31.4 pg (ref 26.6–33.0)
MCHC: 34.4 g/dL (ref 31.5–35.7)
MCV: 91 fL (ref 79–97)
MONOS ABS: 0.4 10*3/uL (ref 0.1–0.9)
Monocytes: 5 %
NEUTROS PCT: 74 %
Neutrophils Absolute: 5.4 10*3/uL (ref 1.4–7.0)
Platelets: 267 10*3/uL (ref 150–379)
RBC: 4.97 x10E6/uL (ref 4.14–5.80)
RDW: 12.6 % (ref 12.3–15.4)
WBC: 7.4 10*3/uL (ref 3.4–10.8)

## 2013-05-19 LAB — LAMOTRIGINE LEVEL: Lamotrigine Lvl: 11.8 ug/mL (ref 2.0–20.0)

## 2013-05-19 LAB — PHENYTOIN LEVEL, TOTAL: Phenytoin Lvl: 9.8 ug/mL — ABNORMAL LOW (ref 10.0–20.0)

## 2013-05-19 NOTE — Telephone Encounter (Signed)
I called patient. The blood work is notable for an elevation in alkaline phosphatase that is fairly minor, stable over the last 2 years. The blood sugar was 363, the patient indicates that he has not been on a proper diet. The Dilantin level is slightly low, but the patient has been well controlled with the seizures recently, we will not change the dosing. The patient has a good Lamictal level.

## 2013-06-12 ENCOUNTER — Other Ambulatory Visit: Payer: Self-pay | Admitting: Neurology

## 2013-07-02 ENCOUNTER — Other Ambulatory Visit: Payer: Self-pay | Admitting: Neurology

## 2013-07-14 ENCOUNTER — Telehealth: Payer: Self-pay | Admitting: Neurology

## 2013-07-14 MED ORDER — PREDNISONE 5 MG PO TABS: ORAL_TABLET | ORAL | Status: DC

## 2013-07-14 NOTE — Telephone Encounter (Signed)
I called pt and he has exposure to poison ivy for about a week. Rash,  Sx Itching/scratching his only treatment   He has no pcp, or he states you are pcp.  I instructed pt it is important to have pcp for things such as this and other needs, that a neurologist does not follow.   He was adamant,  [facility].

## 2013-07-14 NOTE — Telephone Encounter (Signed)
I called patient. The patient has poison ivy on his arm. I have asked him to go on Benadryl cream, and I will call in a prednisone Dosepak. He needs a primary care physician in the future.

## 2013-07-24 ENCOUNTER — Other Ambulatory Visit: Payer: Self-pay | Admitting: Neurology

## 2013-08-22 DIAGNOSIS — S61209A Unspecified open wound of unspecified finger without damage to nail, initial encounter: Secondary | ICD-10-CM | POA: Diagnosis not present

## 2013-08-24 ENCOUNTER — Emergency Department (HOSPITAL_COMMUNITY)
Admission: EM | Admit: 2013-08-24 | Discharge: 2013-08-24 | Disposition: A | Discharge status: Home / Self Care | Payer: Medicare Other | Attending: Emergency Medicine | Admitting: Emergency Medicine

## 2013-08-24 ENCOUNTER — Encounter (HOSPITAL_COMMUNITY): Payer: Self-pay | Admitting: Emergency Medicine

## 2013-08-24 ENCOUNTER — Emergency Department (HOSPITAL_COMMUNITY)
Admission: EM | Admit: 2013-08-24 | Discharge: 2013-08-24 | Disposition: A | Discharge status: Home / Self Care | Payer: Medicare Other | Source: Home / Self Care | Attending: Emergency Medicine | Admitting: Emergency Medicine

## 2013-08-24 DIAGNOSIS — Z87828 Personal history of other (healed) physical injury and trauma: Secondary | ICD-10-CM | POA: Insufficient documentation

## 2013-08-24 DIAGNOSIS — F489 Nonpsychotic mental disorder, unspecified: Secondary | ICD-10-CM | POA: Diagnosis not present

## 2013-08-24 DIAGNOSIS — IMO0002 Reserved for concepts with insufficient information to code with codable children: Secondary | ICD-10-CM | POA: Insufficient documentation

## 2013-08-24 DIAGNOSIS — F3289 Other specified depressive episodes: Secondary | ICD-10-CM | POA: Insufficient documentation

## 2013-08-24 DIAGNOSIS — Y9389 Activity, other specified: Secondary | ICD-10-CM | POA: Insufficient documentation

## 2013-08-24 DIAGNOSIS — F329 Major depressive disorder, single episode, unspecified: Secondary | ICD-10-CM | POA: Diagnosis not present

## 2013-08-24 DIAGNOSIS — Y9289 Other specified places as the place of occurrence of the external cause: Secondary | ICD-10-CM | POA: Insufficient documentation

## 2013-08-24 DIAGNOSIS — R51 Headache: Secondary | ICD-10-CM | POA: Insufficient documentation

## 2013-08-24 DIAGNOSIS — E669 Obesity, unspecified: Secondary | ICD-10-CM | POA: Insufficient documentation

## 2013-08-24 DIAGNOSIS — G8911 Acute pain due to trauma: Secondary | ICD-10-CM

## 2013-08-24 DIAGNOSIS — Z79899 Other long term (current) drug therapy: Secondary | ICD-10-CM | POA: Insufficient documentation

## 2013-08-24 DIAGNOSIS — G40909 Epilepsy, unspecified, not intractable, without status epilepticus: Secondary | ICD-10-CM

## 2013-08-24 DIAGNOSIS — W298XXA Contact with other powered powered hand tools and household machinery, initial encounter: Secondary | ICD-10-CM | POA: Insufficient documentation

## 2013-08-24 DIAGNOSIS — R569 Unspecified convulsions: Secondary | ICD-10-CM

## 2013-08-24 DIAGNOSIS — Z87891 Personal history of nicotine dependence: Secondary | ICD-10-CM | POA: Diagnosis not present

## 2013-08-24 DIAGNOSIS — R32 Unspecified urinary incontinence: Secondary | ICD-10-CM | POA: Diagnosis not present

## 2013-08-24 DIAGNOSIS — S6980XA Other specified injuries of unspecified wrist, hand and finger(s), initial encounter: Secondary | ICD-10-CM | POA: Diagnosis not present

## 2013-08-24 DIAGNOSIS — F411 Generalized anxiety disorder: Secondary | ICD-10-CM | POA: Insufficient documentation

## 2013-08-24 DIAGNOSIS — G2581 Restless legs syndrome: Secondary | ICD-10-CM | POA: Diagnosis not present

## 2013-08-24 DIAGNOSIS — S6990XA Unspecified injury of unspecified wrist, hand and finger(s), initial encounter: Secondary | ICD-10-CM | POA: Diagnosis not present

## 2013-08-24 DIAGNOSIS — I10 Essential (primary) hypertension: Secondary | ICD-10-CM

## 2013-08-24 LAB — I-STAT CHEM 8, ED
BUN: 14 mg/dL (ref 6–23)
CALCIUM ION: 1.19 mmol/L (ref 1.12–1.23)
CREATININE: 0.6 mg/dL (ref 0.50–1.35)
Chloride: 104 mEq/L (ref 96–112)
Glucose, Bld: 294 mg/dL — ABNORMAL HIGH (ref 70–99)
HCT: 45 % (ref 39.0–52.0)
Hemoglobin: 15.3 g/dL (ref 13.0–17.0)
Potassium: 4.4 mEq/L (ref 3.7–5.3)
Sodium: 135 mEq/L — ABNORMAL LOW (ref 137–147)
TCO2: 21 mmol/L (ref 0–100)

## 2013-08-24 LAB — CBC WITH DIFFERENTIAL/PLATELET
Basophils Absolute: 0 10*3/uL (ref 0.0–0.1)
Basophils Relative: 0 % (ref 0–1)
EOS PCT: 1 % (ref 0–5)
Eosinophils Absolute: 0 10*3/uL (ref 0.0–0.7)
HCT: 42.7 % (ref 39.0–52.0)
Hemoglobin: 14.8 g/dL (ref 13.0–17.0)
LYMPHS ABS: 1.1 10*3/uL (ref 0.7–4.0)
LYMPHS PCT: 14 % (ref 12–46)
MCH: 31.1 pg (ref 26.0–34.0)
MCHC: 34.7 g/dL (ref 30.0–36.0)
MCV: 89.7 fL (ref 78.0–100.0)
MONOS PCT: 6 % (ref 3–12)
Monocytes Absolute: 0.5 10*3/uL (ref 0.1–1.0)
Neutro Abs: 6.5 10*3/uL (ref 1.7–7.7)
Neutrophils Relative %: 79 % — ABNORMAL HIGH (ref 43–77)
Platelets: 238 10*3/uL (ref 150–400)
RBC: 4.76 MIL/uL (ref 4.22–5.81)
RDW: 12.5 % (ref 11.5–15.5)
WBC: 8.1 10*3/uL (ref 4.0–10.5)

## 2013-08-24 LAB — LITHIUM LEVEL: Lithium Lvl: 0.34 mEq/L — ABNORMAL LOW (ref 0.80–1.40)

## 2013-08-24 LAB — ETHANOL: Alcohol, Ethyl (B): <11 mg/dL (ref 0–11)

## 2013-08-24 LAB — CBG MONITORING, ED: GLUCOSE-CAPILLARY: 227 mg/dL — AB (ref 70–99)

## 2013-08-24 LAB — PHENYTOIN LEVEL, TOTAL: Phenytoin Lvl: 10.2 ug/mL (ref 10.0–20.0)

## 2013-08-24 MED ORDER — SODIUM CHLORIDE 0.9 % IV BOLUS (SEPSIS)
1000.0000 mL | Freq: Once | INTRAVENOUS | Status: AC
  Administered 2013-08-24: 1000 mL via INTRAVENOUS

## 2013-08-24 MED ORDER — LORAZEPAM 1 MG PO TABS
1.0000 mg | ORAL_TABLET | Freq: Once | ORAL | Status: AC
  Administered 2013-08-24: 1 mg via ORAL
  Filled 2013-08-24: qty 1

## 2013-08-24 MED ORDER — LORAZEPAM 1 MG PO TABS: 1.0000 mg | ORAL_TABLET | Freq: Two times a day (BID) | ORAL | Status: DC
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
## 2013-08-24 NOTE — Discharge Instructions (Signed)
Seizure, Adult °A seizure is abnormal electrical activity in the brain. Seizures usually last from 30 seconds to 2 minutes. There are various types of seizures. °Before a seizure, you may have a warning sensation (aura) that a seizure is about to occur. An aura may include the following symptoms:  °· Fear or anxiety. °· Nausea. °· Feeling like the room is spinning (vertigo). °· Vision changes, such as seeing flashing lights or spots. °Common symptoms during a seizure include: °· A change in attention or behavior (altered mental status). °· Convulsions with rhythmic jerking movements. °· Drooling. °· Rapid eye movements. °· Grunting. °· Loss of bladder and bowel control. °· Bitter taste in the mouth. °· Tongue biting. °After a seizure, you may feel confused and sleepy. You may also have an injury resulting from convulsions during the seizure. °HOME CARE INSTRUCTIONS  °· If you are given medicines, take them exactly as prescribed by your health care provider. °· Keep all follow-up appointments as directed by your health care provider. °· Do not swim or drive or engage in risky activity during which a seizure could cause further injury to you or others until your health care provider says it is OK. °· Get adequate rest. °· Teach friends and family what to do if you have a seizure. They should: °· Lay you on the ground to prevent a fall. °· Put a cushion under your head. °· Loosen any tight clothing around your neck. °· Turn you on your side. If vomiting occurs, this helps keep your airway clear. °· Stay with you until you recover. °· Know whether or not you need emergency care. °SEEK IMMEDIATE MEDICAL CARE IF: °· The seizure lasts longer than 5 minutes. °· The seizure is severe or you do not wake up immediately after the seizure. °· You have an altered mental status after the seizure. °· You are having more frequent or worsening seizures. °Someone should drive you to the emergency department or call local emergency  services (911 in [place] °MAKE SURE YOU: °· Understand these instructions. °· Will watch your condition. °· Will get help right away if you are not doing well or get worse. °Document Released: 01/27/2000 Document Revised: 11/19/2012 Document Reviewed: 09/10/2012 °Exit[employer]® Patient Information ©2015 [employer]. This information is not intended to replace advice given to you by your health care provider. Make sure you discuss any questions you have with your health care provider. ° °Driving and Equipment Restrictions °Some medical problems make it dangerous to drive, ride a bike, or use machines. Some of these problems are: °· A hard blow to the head (concussion). °· Passing out (fainting). °· Twitching and shaking (seizures). °· Low blood sugar. °· Taking medicine to help you relax (sedatives). °· Taking pain medicines. °· Wearing an eye patch. °· Wearing splints. This can make it hard to use parts of your body that you need to drive safely. °HOME CARE  °· Do not drive until your doctor says it is okay. °· Do not use machines until your doctor says it is okay. °You may need a form signed by your doctor (medical release) before you can drive again. You may also need this form before you do other tasks where you need to be fully alert. °MAKE SURE YOU: °· Understand these instructions. °· Will watch your condition. °· Will get help right away if you are not doing well or get worse. °Document Released: 03/08/2004 Document Revised: 04/23/2011 Document Reviewed: 06/08/2009 °Exit[employer]® Patient Information ©2015 [employer].   This information is not intended to replace advice given to you by your health care provider. Make sure you discuss any questions you have with your health care provider. ° °
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
## 2013-08-24 NOTE — Discharge Instructions (Signed)
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
## 2013-08-26 ENCOUNTER — Ambulatory Visit (INDEPENDENT_AMBULATORY_CARE_PROVIDER_SITE_OTHER): Payer: Medicare Other | Admitting: Neurology

## 2013-08-26 ENCOUNTER — Encounter: Payer: Self-pay | Admitting: Neurology

## 2013-08-26 VITALS — BP 127/80 | HR 81 | Wt 252.0 lb

## 2013-08-26 DIAGNOSIS — G2581 Restless legs syndrome: Secondary | ICD-10-CM

## 2013-08-26 DIAGNOSIS — G40319 Generalized idiopathic epilepsy and epileptic syndromes, intractable, without status epilepticus: Secondary | ICD-10-CM

## 2013-08-26 NOTE — ED Provider Notes (Signed)
Medical screening examination/treatment/procedure(s) were conducted as a shared visit with non-physician practitioner(s) or resident and myself. I personally evaluated the patient during the encounter and agree with the findings and plan unless otherwise indicated.  I have personally reviewed any xrays and/ or EKG's with the provider and I agree with interpretation.  patient is here disorder presents after 3-5 minute grand mal seizure, similar to previous. Patient states she's been taking his medicines as directed. He has close followup outpatient appointment this week. Currently patient denies all symptoms except for abrasion to his head. On exam patient overall well-appearing, alert 90, neck supple, no abdominal or chest pain, lungs clear to auscultation equal strength and sensation bilateral, eczema the muscle function intact. Discussed observation in the hospital versus close followup outpatient his neurologist and patient prefers to followup outpatient. When necessary Ativan for home.  Seizures   Mariea Clonts, MD 08/26/13 [phone]

## 2013-08-26 NOTE — ED Provider Notes (Signed)
Medical screening examination/treatment/procedure(s) were conducted as a shared visit with non-physician practitioner(s) and myself.  I personally evaluated the patient during the encounter.  Pt with hx seizures and periodic seizures despite meds, presents after for him a typical seizure.  Current awake and alert, w mental status c/w baseline.  No c/o of pain. No fever or chills. Spine nt. abd soft nt. Chest cta.  Labs.  Mirna Mires, MD 08/26/13 [phone]
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
## 2013-08-26 NOTE — Patient Instructions (Signed)

## 2013-08-27 LAB — LAMOTRIGINE LEVEL: Lamotrigine Lvl: 2.3 ug/mL — ABNORMAL LOW (ref 4.0–18.0)

## 2013-09-01 DIAGNOSIS — H6120 Impacted cerumen, unspecified ear: Secondary | ICD-10-CM | POA: Diagnosis not present

## 2013-09-02 ENCOUNTER — Other Ambulatory Visit: Payer: Self-pay | Admitting: Neurology

## 2013-09-02 DIAGNOSIS — H6690 Otitis media, unspecified, unspecified ear: Secondary | ICD-10-CM | POA: Diagnosis not present

## 2013-09-03 NOTE — Telephone Encounter (Signed)
We have been prescribing this since 2013 per [facility]

## 2013-09-23 ENCOUNTER — Other Ambulatory Visit: Payer: Self-pay | Admitting: Neurology

## 2013-09-30 ENCOUNTER — Other Ambulatory Visit: Payer: Self-pay | Admitting: Neurology

## 2013-09-30 MED ORDER — LAMOTRIGINE 100 MG PO TABS: 100.0000 mg | ORAL_TABLET | Freq: Two times a day (BID) | ORAL | Status: DC

## 2013-09-30 MED ORDER — PHENYTOIN SODIUM EXTENDED 100 MG PO CAPS: 300.0000 mg | ORAL_CAPSULE | Freq: Two times a day (BID) | ORAL | Status: DC

## 2013-09-30 MED ORDER — LACOSAMIDE 200 MG PO TABS: 200.0000 mg | ORAL_TABLET | Freq: Two times a day (BID) | ORAL | Status: DC

## 2013-09-30 MED ORDER — LAMOTRIGINE 200 MG PO TABS: 400.0000 mg | ORAL_TABLET | Freq: Two times a day (BID) | ORAL | Status: DC

## 2013-10-01 NOTE — Telephone Encounter (Signed)
Rx has been faxed.

## 2013-10-22 ENCOUNTER — Other Ambulatory Visit: Payer: Self-pay

## 2013-10-22 MED ORDER — PRAMIPEXOLE DIHYDROCHLORIDE 0.75 MG PO TABS: 0.7500 mg | ORAL_TABLET | Freq: Every day | ORAL | Status: DC

## 2013-11-23 ENCOUNTER — Telehealth: Payer: Self-pay | Admitting: *Deleted

## 2013-11-23 DIAGNOSIS — E119 Type 2 diabetes mellitus without complications: Secondary | ICD-10-CM | POA: Diagnosis not present

## 2013-11-23 DIAGNOSIS — R7309 Other abnormal glucose: Secondary | ICD-10-CM | POA: Diagnosis not present

## 2013-11-23 DIAGNOSIS — Z23 Encounter for immunization: Secondary | ICD-10-CM | POA: Diagnosis not present

## 2013-11-23 DIAGNOSIS — I10 Essential (primary) hypertension: Secondary | ICD-10-CM | POA: Diagnosis not present

## 2013-11-23 DIAGNOSIS — E782 Mixed hyperlipidemia: Secondary | ICD-10-CM | POA: Diagnosis not present

## 2013-11-23 NOTE — Telephone Encounter (Signed)
I called the patient. He now has a primary care physician, I will fax the last note to him. The patient is going to be managed with his diabetes and his blood pressure issues through his primary care physician.

## 2013-11-23 NOTE — Telephone Encounter (Signed)
Spoke to patient and he went to his PCP, Dr. Maury Dus today and had his blood work done.  He also asked that I fax a list of his medications to Dr. Alyson Ingles.  The patient relayed that Dr. Alyson Ingles would like to take over prescribing his blood pressure medication.  I told him I would relay that to Dr. Jannifer Franklin.

## 2013-11-26 ENCOUNTER — Encounter: Payer: Self-pay | Admitting: Nurse Practitioner

## 2013-11-26 ENCOUNTER — Ambulatory Visit (INDEPENDENT_AMBULATORY_CARE_PROVIDER_SITE_OTHER): Payer: Medicare Other | Admitting: Nurse Practitioner

## 2013-11-26 VITALS — BP 140/85 | HR 81 | Ht 68.0 in | Wt 240.0 lb

## 2013-11-26 DIAGNOSIS — G2581 Restless legs syndrome: Secondary | ICD-10-CM | POA: Diagnosis not present

## 2013-11-26 DIAGNOSIS — G40319 Generalized idiopathic epilepsy and epileptic syndromes, intractable, without status epilepticus: Secondary | ICD-10-CM

## 2013-11-26 DIAGNOSIS — G40311 Generalized idiopathic epilepsy and epileptic syndromes, intractable, with status epilepticus: Secondary | ICD-10-CM | POA: Diagnosis not present

## 2013-11-26 DIAGNOSIS — F5102 Adjustment insomnia: Secondary | ICD-10-CM

## 2013-11-26 DIAGNOSIS — Z5181 Encounter for therapeutic drug level monitoring: Secondary | ICD-10-CM

## 2013-11-26 MED ORDER — CHOLECALCIFEROL 25 MCG (1000 UT) PO CAPS: ORAL_CAPSULE | ORAL | Status: DC

## 2013-11-26 MED ORDER — PHENYTOIN SODIUM EXTENDED 100 MG PO CAPS: 300.0000 mg | ORAL_CAPSULE | Freq: Two times a day (BID) | ORAL | Status: DC

## 2013-11-26 MED ORDER — LAMOTRIGINE 200 MG PO TABS: 400.0000 mg | ORAL_TABLET | Freq: Two times a day (BID) | ORAL | Status: DC

## 2013-11-26 MED ORDER — LAMOTRIGINE 100 MG PO TABS: 100.0000 mg | ORAL_TABLET | Freq: Two times a day (BID) | ORAL | Status: DC

## 2013-11-26 MED ORDER — ZOLPIDEM TARTRATE 10 MG PO TABS: 10.0000 mg | ORAL_TABLET | Freq: Every evening | ORAL | Status: DC | PRN

## 2013-11-26 MED ORDER — LACOSAMIDE 200 MG PO TABS: 200.0000 mg | ORAL_TABLET | Freq: Two times a day (BID) | ORAL | Status: DC

## 2013-11-26 MED ORDER — PRAMIPEXOLE DIHYDROCHLORIDE 0.75 MG PO TABS: 0.7500 mg | ORAL_TABLET | Freq: Every day | ORAL | Status: DC

## 2013-11-26 NOTE — Patient Instructions (Addendum)
Continue Dilantin taking 600 mg twice daily. Continue all other medications at the same dose.  I am giving you a script for Ambien, 10 mg to help you sleep. Use only as needed.  We will check your Dilantin and lamictal levels today and call you when we have results.  The patient will followup with Dr. Jannifer Franklin in about 3 months.

## 2013-11-26 NOTE — Progress Notes (Signed)
PATIENT: Eric Fernandez DOB: 1975/07/05  REASON FOR VISIT: routine follow up for seizures HISTORY FROM: patient  HISTORY OF PRESENT ILLNESS: Eric Fernandez is a 38 year old right-handed white male with a history of intractable seizures. Last seizures were noted on 08/24/2013. He had 2 events on the same day. He is unsure, but he thinks he may have missed some of his medication doses.  At last visit with Dr. Jannifer Fernandez on 08/26/13, his Dilantin dose was increased to 600 mg twice daily.  He has not had any seizure since last visit. The patient is feeling well at this time.  He reports no increase in drowsiness or worsening coordination. He reports that he is troubled lately by finding out that he is Diabetic and having to check his glucose several times a day. He states that his mother is very worried about him and that all of this worry is making it very hard for him to sleep at night. He has established care with Dr. Maury Dus, MD, who is managing his hypertension and diabetes. The patient returns for an evaluation. The patient has a vagal nerve stimulator in place.  REVIEW OF SYSTEMS: Full 14 system review of systems performed and notable only for:  Memory loss, seizures, tremors Depression Restless legs, snoring, Insomnia  ALLERGIES: No Known Allergies  HOME MEDICATIONS: Outpatient Prescriptions Prior to Visit  Medication Sig Dispense Refill  . amLODipine (NORVASC) 5 MG tablet Take 5 mg by mouth daily.      . cholecalciferol (VITAMIN D) 1000 UNITS tablet Take 1,000 Units by mouth daily.      . [employer] VITAMIN D3 1000 UNITS capsule TAKE 1 CAPSULE BY MOUTH TWICE DAILY.  100 capsule  4  . escitalopram (LEXAPRO) 20 MG tablet Take 20 mg by mouth daily.      Marland Kitchen ibuprofen (ADVIL,MOTRIN) 800 MG tablet Take 800 mg by mouth every 8 (eight) hours as needed for mild pain.      Marland Kitchen lacosamide (VIMPAT) 200 MG TABS tablet Take 1 tablet (200 mg total) by mouth 2 (two) times daily.  180 tablet  1  . lamoTRIgine  (LAMICTAL) 100 MG tablet Take 1 tablet (100 mg total) by mouth 2 (two) times daily.  180 tablet  1  . lamoTRIgine (LAMICTAL) 200 MG tablet Take 2 tablets (400 mg total) by mouth 2 (two) times daily.  360 tablet  1  . lisinopril (PRINIVIL,ZESTRIL) 10 MG tablet Take 10 mg by mouth daily.      Marland Kitchen lithium carbonate 300 MG capsule Take 900 mg by mouth at bedtime.      Marland Kitchen LORazepam (ATIVAN) 1 MG tablet Take 1 tablet (1 mg total) by mouth 2 (two) times daily.  10 tablet  0  . OVER THE COUNTER MEDICATION Take 1 tablet by mouth 2 (two) times daily. Bone Density Medication      . phenytoin (DILANTIN) 100 MG ER capsule Take 3 capsules (300 mg total) by mouth 2 (two) times daily.  540 capsule  1  . pramipexole (MIRAPEX) 0.75 MG tablet Take 1 tablet (0.75 mg total) by mouth at bedtime.  90 tablet  1   No facility-administered medications prior to visit.    PHYSICAL EXAM Filed Vitals:   11/26/13 1005  BP: 140/85  Pulse: 81  Height: 5\' 8"  (1.727 m)  Weight: 240 lb (108.863 kg)   Body mass index is 36.5 kg/(m^2).  Physical Exam  General: The patient is alert and cooperative at the time of the  examination.  Skin: No significant peripheral edema is noted.   Neurologic Exam  Mental status: The patient is oriented x 3.  Cranial nerves: Facial symmetry is present. Speech is normal, no aphasia or dysarthria is noted. Extraocular movements are full. Visual fields are full. The patient does have an excoriation on the left forehead.  Motor: The patient has good strength in all 4 extremities.  Sensory examination: Soft touch sensation is symmetric on the face, arms, and legs.  Coordination: The patient has good finger-nose-finger and heel-to-shin bilaterally.  Gait and station: The patient has a normal gait. Tandem gait is normal. Romberg is negative. No drift is seen.  Reflexes: Deep tendon reflexes are symmetric.   ASSESSMENT: 38 y.o. year old male  has a past medical history of Hypertension; Mental  disorder; Anxiety; Depression; Mild obesity; Seizures; Suicide and self-inflicted injury; and Restless legs syndrome (RLS) here with:  1. Intractable seizures  2. Restless leg syndrome  3. Insomnia  PLAN: The patient has a vagal nerves stimulator in place, this was interrogated at last visit, and it appears to be working well. The patient will continue Dilantin taking 600 mg twice daily. We will recheck his Dilantin and Lamotrigine levels today. I will write restart him on Ambien for insomnia, as he indicates that this has worked well for him in the past..   Orders Placed This Encounter  Procedures  . Phenytoin level, total and free  . Lamotrigine level  . CMP   Meds ordered this encounter  Medications  . zolpidem (AMBIEN) 10 MG tablet    Sig: Take 1 tablet (10 mg total) by mouth at bedtime as needed for sleep.    Dispense:  30 tablet    Refill:  0    Order Specific Question:  Supervising Provider    Answer:  Eric Fernandez [[id number]]   Meds ordered this encounter  Medications  . zolpidem (AMBIEN) 10 MG tablet    Sig: Take 1 tablet (10 mg total) by mouth at bedtime as needed for sleep.    Dispense:  30 tablet    Refill:  2    Order Specific Question:  Supervising Provider    Answer:  Eric Fernandez [[id number]]  . lacosamide (VIMPAT) 200 MG TABS tablet    Sig: Take 1 tablet (200 mg total) by mouth 2 (two) times daily.    Dispense:  180 tablet    Refill:  1    Pharmacy Fax [phone]    Order Specific Question:  Supervising Provider    Answer:  Eric Fernandez [[id number]]  . lamoTRIgine (LAMICTAL) 200 MG tablet    Sig: Take 2 tablets (400 mg total) by mouth 2 (two) times daily.    Dispense:  360 tablet    Refill:  1    Brand Medically Necessary    Order Specific Question:  Supervising Provider    Answer:    . pramipexole (MIRAPEX) 0.75 MG tablet    Sig: Take 1 tablet (0.75 mg total) by mouth at bedtime.    Dispense:  90 tablet    Refill:  1    Order Specific Question:   Supervising Provider    Answer:  Eric Fernandez [[id number]]  . phenytoin (DILANTIN) 100 MG ER capsule    Sig: Take 3 capsules (300 mg total) by mouth 2 (two) times daily.    Dispense:  540 capsule    Refill:  1    Brand Medically Necessary    Order Specific  Question:  Supervising Provider    Answer:  Penni Bombard [[id number]]  . Cholecalciferol ([employer]S VITAMIN D3) 1000 UNITS capsule    Sig: TAKE 1 CAPSULE BY MOUTH TWICE DAILY.    Dispense:  100 capsule    Refill:  4    Order Specific Question:  Supervising Provider    Answer:  Eric Fernandez [[id number]]    Rudi Rummage Marckus Hanover, MSN, FNP-BC, A/GNP-C 11/26/2013, 10:25 AM [facility] [address], [place], [place] [address] [phone]  Note: This document was prepared with digital dictation and possible smart phrase technology. Any transcriptional errors that result from this process are unintentional.

## 2013-11-29 ENCOUNTER — Encounter: Payer: Self-pay | Admitting: Nurse Practitioner

## 2013-11-29 DIAGNOSIS — E119 Type 2 diabetes mellitus without complications: Secondary | ICD-10-CM | POA: Insufficient documentation

## 2013-11-30 NOTE — Progress Notes (Signed)
I have read the note, and I agree with the clinical assessment and plan.  Bryton Waight KEITH   

## 2013-12-03 LAB — COMPREHENSIVE METABOLIC PANEL
ALT: 21 IU/L (ref 0–44)
AST: 18 IU/L (ref 0–40)
Albumin/Globulin Ratio: 2.6 — ABNORMAL HIGH (ref 1.1–2.5)
Albumin: 4.7 g/dL (ref 3.5–5.5)
Alkaline Phosphatase: 152 IU/L — ABNORMAL HIGH (ref 39–117)
BUN/Creatinine Ratio: 20 — ABNORMAL HIGH (ref 8–19)
BUN: 10 mg/dL (ref 6–20)
CO2: 21 mmol/L (ref 18–29)
Calcium: 9.4 mg/dL (ref 8.7–10.2)
Chloride: 95 mmol/L — ABNORMAL LOW (ref 97–108)
Creatinine, Ser: 0.5 mg/dL — ABNORMAL LOW (ref 0.76–1.27)
GFR calc Af Amer: 160 mL/min/{1.73_m2} (ref >59)
GFR calc non Af Amer: 138 mL/min/{1.73_m2} (ref >59)
Globulin, Total: 1.8 g/dL (ref 1.5–4.5)
Glucose: 326 mg/dL — ABNORMAL HIGH (ref 65–99)
Potassium: 4.5 mmol/L (ref 3.5–5.2)
Sodium: 136 mmol/L (ref 134–144)
Total Bilirubin: 0.3 mg/dL (ref 0.0–1.2)
Total Protein: 6.5 g/dL (ref 6.0–8.5)

## 2013-12-03 LAB — LAMOTRIGINE LEVEL: Lamotrigine Lvl: 8.3 ug/mL (ref 2.0–20.0)

## 2013-12-03 LAB — PHENYTOIN LEVEL, FREE
Phenytoin, Free: 1.1 ug/mL (ref 1.0–2.0)
Phenytoin: 10.5 ug/mL (ref 10.0–20.0)

## 2013-12-08 ENCOUNTER — Other Ambulatory Visit: Payer: Self-pay | Admitting: Neurology

## 2013-12-09 NOTE — Telephone Encounter (Signed)
Last OV note says: He has established care with Dr. Maury Dus, MD, who is managing his hypertension and diabetes.

## 2013-12-10 NOTE — Progress Notes (Signed)
Quick Note:  Shared results with patient, verbalized understanding but is requesting to schedule a consultation with a nurtritionist ______

## 2013-12-15 NOTE — Progress Notes (Signed)
Shared message with patient , verbalized understanding and said that he has a scheduled appt with nutriontrist in Dec.

## 2013-12-21 DIAGNOSIS — E1165 Type 2 diabetes mellitus with hyperglycemia: Secondary | ICD-10-CM | POA: Diagnosis not present

## 2013-12-29 ENCOUNTER — Other Ambulatory Visit: Payer: Self-pay | Admitting: Neurology

## 2013-12-29 NOTE — Telephone Encounter (Signed)
Prescribed at [place] 10/2012

## 2014-01-15 ENCOUNTER — Other Ambulatory Visit: Payer: Self-pay | Admitting: Neurology

## 2014-01-18 ENCOUNTER — Ambulatory Visit: Payer: Medicare Other | Admitting: Skilled Nursing Facility1

## 2014-01-21 ENCOUNTER — Emergency Department (HOSPITAL_COMMUNITY)
Admission: EM | Admit: 2014-01-21 | Discharge: 2014-01-21 | Discharge status: Against Medical Advice | Payer: Medicare Other | Attending: Emergency Medicine | Admitting: Emergency Medicine

## 2014-01-21 ENCOUNTER — Encounter (HOSPITAL_COMMUNITY): Payer: Self-pay | Admitting: *Deleted

## 2014-01-21 DIAGNOSIS — R569 Unspecified convulsions: Secondary | ICD-10-CM

## 2014-01-21 DIAGNOSIS — F329 Major depressive disorder, single episode, unspecified: Secondary | ICD-10-CM | POA: Insufficient documentation

## 2014-01-21 DIAGNOSIS — Z87891 Personal history of nicotine dependence: Secondary | ICD-10-CM | POA: Insufficient documentation

## 2014-01-21 DIAGNOSIS — E669 Obesity, unspecified: Secondary | ICD-10-CM | POA: Diagnosis not present

## 2014-01-21 DIAGNOSIS — I10 Essential (primary) hypertension: Secondary | ICD-10-CM | POA: Diagnosis not present

## 2014-01-21 DIAGNOSIS — G40909 Epilepsy, unspecified, not intractable, without status epilepticus: Secondary | ICD-10-CM | POA: Diagnosis not present

## 2014-01-21 DIAGNOSIS — Z79899 Other long term (current) drug therapy: Secondary | ICD-10-CM | POA: Insufficient documentation

## 2014-01-21 DIAGNOSIS — G2581 Restless legs syndrome: Secondary | ICD-10-CM | POA: Insufficient documentation

## 2014-01-21 DIAGNOSIS — F419 Anxiety disorder, unspecified: Secondary | ICD-10-CM | POA: Diagnosis not present

## 2014-01-21 LAB — I-STAT CHEM 8, ED
BUN: 18 mg/dL (ref 6–23)
CALCIUM ION: 1.2 mmol/L (ref 1.12–1.23)
Chloride: 102 mEq/L (ref 96–112)
Creatinine, Ser: 0.6 mg/dL (ref 0.50–1.35)
Glucose, Bld: 128 mg/dL — ABNORMAL HIGH (ref 70–99)
HEMATOCRIT: 42 % (ref 39.0–52.0)
Hemoglobin: 14.3 g/dL (ref 13.0–17.0)
Potassium: 4 mEq/L (ref 3.7–5.3)
Sodium: 137 mEq/L (ref 137–147)
TCO2: 23 mmol/L (ref 0–100)

## 2014-01-21 LAB — CBG MONITORING, ED: GLUCOSE-CAPILLARY: 132 mg/dL — AB (ref 70–99)

## 2014-01-21 LAB — PHENYTOIN LEVEL, TOTAL: PHENYTOIN LVL: 8.3 ug/mL — AB (ref 10.0–20.0)

## 2014-01-21 NOTE — ED Provider Notes (Signed)
CSN: [id number]     Arrival date & time 01/21/14  1747 History   First MD Initiated Contact with Patient 01/21/14 1748     Chief Complaint  Patient presents with  . Seizures     (Consider location/radiation/quality/duration/timing/severity/associated sxs/prior Treatment) HPI  Pt with hx seizure disorder presents with c/o seizure today.  Seizure was witness by neighbor, pt has no memory of event.  He feels back to his baseline now.  Pt takes lamictal, dilantin for seizures.  States that he has been taking medication regularly.  No fever/chills.  Denies cough, vomiting, dysuria or other signs of infection.  No head trauma.  Pt states last seizure was approx 2 months ago.  He is followed by [place] neurolog[facility].  Seizure occurred earlier today, lasted several minutes and is completely resolved at this time.  There are no other associated systemic symptoms, there are no other alleviating or modifying factors.   Past Medical History  Diagnosis Date  . Hypertension   . Mental disorder     border line bipolar  . Anxiety   . Depression   . Mild obesity   . Seizures     Intractible  . Suicide and self-inflicted injury   . Restless legs syndrome (RLS) 11/06/2012    Right leg   Past Surgical History  Procedure Laterality Date  . Burn debridement surgery    . Vagus nerve stimulator insertion    . Joint replacement      rotator cuff surgery   lt  . Direct laryngoscopy with radiaesse injection  04/11/2011    Procedure: DIRECT LARYNGOSCOPY WITH RADIAESSE INJECTION;  Surgeon: Melida Quitter, MD;  Location: [facility];  Service: ENT;  Laterality: N/A;  MICRO DIRECT LARYNGOSCOPY WITH LEFT VOCAL FOLD RADIESSE INJECTION/JET VENTURI VENTILATION  . Shoulder surgery N/A    Family History  Problem Relation Age of Onset  . Diabetes Mother   . ALS Maternal Aunt   . Cancer Maternal Uncle    History  Substance Use Topics  . Smoking status: Former Research scientist (life sciences)  . Smokeless tobacco: Never Used     Comment: Quit  when he was 38 years old  . Alcohol Use: No    Review of Systems  ROS reviewed and all otherwise negative except for mentioned in HPI    Allergies  Review of patient's allergies indicates no known allergies.  Home Medications   Prior to Admission medications   Medication Sig Start Date End Date Taking? Authorizing Provider  amLODipine (NORVASC) 5 MG tablet Take 5 mg by mouth daily.    Historical Provider, MD  amLODipine (NORVASC) 5 MG tablet TAKE ONE TABLET BY MOUTH DAILY 01/15/14   Kathrynn Ducking, MD  Cholecalciferol ([employer] VITAMIN D3) 1000 UNITS capsule TAKE 1 CAPSULE BY MOUTH TWICE DAILY. 11/26/13   Philmore Pali, NP  escitalopram (LEXAPRO) 20 MG tablet Take 20 mg by mouth daily.    Historical Provider, MD  escitalopram (LEXAPRO) 20 MG tablet TAKE ONE TABLET BY MOUTH DAILY 12/09/13   Kathrynn Ducking, MD  ibuprofen (ADVIL,MOTRIN) 800 MG tablet Take 800 mg by mouth every 8 (eight) hours as needed for mild pain.    Historical Provider, MD  lacosamide (VIMPAT) 200 MG TABS tablet Take 1 tablet (200 mg total) by mouth 2 (two) times daily. 11/26/13   Philmore Pali, NP  lamoTRIgine (LAMICTAL) 200 MG tablet Take 2 tablets (400 mg total) by mouth 2 (two) times daily. 11/26/13   Philmore Pali, NP  lisinopril (PRINIVIL,ZESTRIL) 10 MG tablet Take 10 mg by mouth daily.    Historical Provider, MD  lisinopril (PRINIVIL,ZESTRIL) 10 MG tablet TAKE 1 TABLET BY MOUTH EVERY DAY 01/15/14   Kathrynn Ducking, MD  lithium 300 MG tablet TAKE 1 TABLET (300 MG TOTAL) BY MOUTH 3 (THREE) TIMES DAILY WITH MEALS. 12/29/13   Kathrynn Ducking, MD  lithium carbonate 300 MG capsule Take 900 mg by mouth at bedtime.    Historical Provider, MD  LORazepam (ATIVAN) 1 MG tablet Take 1 tablet (1 mg total) by mouth 2 (two) times daily. 08/24/13   Freddi Che, MD  metFORMIN (GLUCOPHAGE) 500 MG tablet Take 500 mg by mouth daily. 11/23/13   Historical Provider, MD  ofloxacin (FLOXIN) 0.3 % otic solution  09/02/13   Historical Provider, MD   OVER THE COUNTER MEDICATION Take 1 tablet by mouth 2 (two) times daily. Bone Density Medication    Historical Provider, MD  phenytoin (DILANTIN) 100 MG ER capsule Take 3 capsules (300 mg total) by mouth 2 (two) times daily. 11/26/13   Philmore Pali, NP  pramipexole (MIRAPEX) 0.75 MG tablet Take 1 tablet (0.75 mg total) by mouth at bedtime. 11/26/13   Philmore Pali, NP   BP 123/68 mmHg  Pulse 87  Temp(Src) 98.6 F (37 C) (Oral)  Resp 18  SpO2 96%  Vitals reviewed Physical Exam  Physical Examination: General appearance - alert, well appearing, and in no distress Mental status - alert, oriented to person, place, and time Eyes - pupils equal and reactive, extraocular eye movements intact Mouth - mucous membranes moist, pharynx normal without lesions, small laceration approx 49mm to right side of tongue- not throught and through, no active bleeding Neck - supple, no significant adenopathy Chest - clear to auscultation, no wheezes, rales or rhonchi, symmetric air entry Heart - normal rate, regular rhythm, normal S1, S2, no murmurs, rubs, clicks or gallops Abdomen - soft, nontender, nondistended, no masses or organomegaly Neurological - alert, oriented x 3, cranial nerves 2-12 tested and intact, strength 5/5 in extremitiies x 4, sensation intact Extremities - peripheral pulses normal, no pedal edema, no clubbing or cyanosis Skin - normal coloration and turgor, no rashes  ED Course  Procedures (including critical care time)  8:37 PM pt requesting to leave AMA. He had initially upon my first evaluation requested to sign himself out- I encouraged him to stay to wait for dilantin level, but 2 hours have elapsed and no dilantin result so now he again requests to leave AMA.  He is oriented and not post-ictal.  He has capacity to make this decision.   Labs Review Labs Reviewed  PHENYTOIN LEVEL, TOTAL - Abnormal; Notable for the following:    Phenytoin Lvl 8.3 (*)    All other components within normal  limits  CBG MONITORING, ED - Abnormal; Notable for the following:    Glucose-Capillary 132 (*)    All other components within normal limits  I-STAT CHEM 8, ED - Abnormal; Notable for the following:    Glucose, Bld 128 (*)    All other components within normal limits    Imaging Review No results found.   EKG Interpretation None      MDM   Final diagnoses:  Seizure  Seizure disorder   Pt presenting with c/o seizure earlier today- hx of seizure disorder.  No signs of infection, pt was postictal but is now back to baseline mental status.  He is requesting to leave AMA- see note  above.  Dilantin level is pending but patient does not want to wait longer for this result.  Discussed strict return precautions and importance of neurology followup.     Threasa Beards, MD 01/21/14 [phone]

## 2014-01-21 NOTE — Discharge Instructions (Signed)
Return to the ED with any concerns including recurrent seizure, vomiting and not able to keep down liquids, fainting, decreased level of alertness/lethargy, or any other alarming symptoms

## 2014-01-21 NOTE — ED Notes (Signed)
Bed: WA17 Expected date:  Expected time:  Means of arrival:  Comments: Ems seizure

## 2014-01-21 NOTE — ED Notes (Signed)
Patient brought in by [facility] due to seizures x 2 that were witnessed by family  Patient with hx of seizures, per EMS Patient was postictal when EMS arrived to home Patient remains lethargic, but improved per EMS RR WNL--even and unlabored with equal rise and fall of chest Patient in NAD

## 2014-01-22 ENCOUNTER — Telehealth: Payer: Self-pay | Admitting: Neurology

## 2014-01-22 NOTE — Telephone Encounter (Signed)
Patient's mother is calling. Patient had a seizure yesterday around 4pm and was taken to [facility]. Patient did have lab done at the hospital and patient did not wait for results. The hospital told patient that Dr. Jannifer Franklin can pull up labs and give results to the patient. Patient says he takes his seizure medicine twice daily. Patient's mother is very concerned. Please call Eric Fernandez at [phone] with results and to discuss. Thank you.

## 2014-01-22 NOTE — Telephone Encounter (Signed)
I called the patient. I left a message. The patient had a seizure yesterday, Dilantin level was 8.3. The patient is on 300 mg twice daily of Dilantin. If he did not miss a dose of the Dilantin, we may have to go up on the dosing to 200 mg in the morning, 300 mg at night, and 200 mg at midday. The patient is to contact our office.

## 2014-01-25 ENCOUNTER — Ambulatory Visit: Payer: Medicare Other | Admitting: Skilled Nursing Facility1

## 2014-02-22 DIAGNOSIS — I10 Essential (primary) hypertension: Secondary | ICD-10-CM | POA: Diagnosis not present

## 2014-02-22 DIAGNOSIS — E119 Type 2 diabetes mellitus without complications: Secondary | ICD-10-CM | POA: Diagnosis not present

## 2014-02-25 ENCOUNTER — Telehealth: Payer: Self-pay | Admitting: Neurology

## 2014-02-25 NOTE — Telephone Encounter (Signed)
Pt is calling to see if it is really necessary to keep his appointment.  He states he is doing good and not having any problems.  Please call and advise.

## 2014-02-26 NOTE — Telephone Encounter (Signed)
I called the patient. The patient has rescheduled his appointment for June 2016.

## 2014-02-26 NOTE — Telephone Encounter (Signed)
According to patient's chart he had a seizure on 01-21-14.  I will ask doctor's advise before cancelling appointment.

## 2014-03-01 ENCOUNTER — Ambulatory Visit: Payer: Medicare Other | Admitting: Neurology

## 2014-03-11 DIAGNOSIS — E119 Type 2 diabetes mellitus without complications: Secondary | ICD-10-CM | POA: Diagnosis not present

## 2014-04-06 ENCOUNTER — Other Ambulatory Visit: Payer: Self-pay

## 2014-04-06 MED ORDER — ZOLPIDEM TARTRATE 10 MG PO TABS: 10.0000 mg | ORAL_TABLET | Freq: Every evening | ORAL | Status: DC | PRN

## 2014-04-06 NOTE — Telephone Encounter (Signed)
Rx signed and faxed.

## 2014-04-12 ENCOUNTER — Other Ambulatory Visit: Payer: Self-pay | Admitting: Nurse Practitioner

## 2014-04-12 ENCOUNTER — Other Ambulatory Visit: Payer: Self-pay | Admitting: Neurology

## 2014-05-10 ENCOUNTER — Telehealth: Payer: Self-pay | Admitting: Neurology

## 2014-05-10 DIAGNOSIS — E0840 Diabetes mellitus due to underlying condition with diabetic neuropathy, unspecified: Secondary | ICD-10-CM

## 2014-05-10 DIAGNOSIS — R569 Unspecified convulsions: Secondary | ICD-10-CM

## 2014-05-10 NOTE — Telephone Encounter (Signed)
I called the mother. The mother indicates that she is concerned that her son is not taking care of himself, not eating properly, not getting his medications in. We will get a nurse out to the house to organize medications, help with diabetic dietary training. We will try to get aid and assistance as well if possible.

## 2014-05-10 NOTE — Telephone Encounter (Addendum)
Patient's Mother is calling as she would like to talk about establishing  home health care in her son's home. Please call [phone].

## 2014-05-11 ENCOUNTER — Emergency Department (HOSPITAL_COMMUNITY)
Admission: EM | Admit: 2014-05-11 | Discharge: 2014-05-11 | Disposition: A | Discharge status: Home / Self Care | Payer: Medicare Other | Attending: Emergency Medicine | Admitting: Emergency Medicine

## 2014-05-11 ENCOUNTER — Encounter (HOSPITAL_COMMUNITY): Payer: Self-pay | Admitting: *Deleted

## 2014-05-11 DIAGNOSIS — Z8659 Personal history of other mental and behavioral disorders: Secondary | ICD-10-CM | POA: Diagnosis not present

## 2014-05-11 DIAGNOSIS — G40909 Epilepsy, unspecified, not intractable, without status epilepticus: Secondary | ICD-10-CM | POA: Insufficient documentation

## 2014-05-11 DIAGNOSIS — Z79899 Other long term (current) drug therapy: Secondary | ICD-10-CM | POA: Insufficient documentation

## 2014-05-11 DIAGNOSIS — E119 Type 2 diabetes mellitus without complications: Secondary | ICD-10-CM | POA: Insufficient documentation

## 2014-05-11 DIAGNOSIS — R404 Transient alteration of awareness: Secondary | ICD-10-CM | POA: Diagnosis not present

## 2014-05-11 DIAGNOSIS — E669 Obesity, unspecified: Secondary | ICD-10-CM | POA: Insufficient documentation

## 2014-05-11 DIAGNOSIS — G2581 Restless legs syndrome: Secondary | ICD-10-CM | POA: Diagnosis not present

## 2014-05-11 DIAGNOSIS — I10 Essential (primary) hypertension: Secondary | ICD-10-CM | POA: Insufficient documentation

## 2014-05-11 DIAGNOSIS — Z87891 Personal history of nicotine dependence: Secondary | ICD-10-CM | POA: Diagnosis not present

## 2014-05-11 DIAGNOSIS — R112 Nausea with vomiting, unspecified: Secondary | ICD-10-CM | POA: Insufficient documentation

## 2014-05-11 DIAGNOSIS — R42 Dizziness and giddiness: Secondary | ICD-10-CM | POA: Diagnosis not present

## 2014-05-11 HISTORY — DX: Type 2 diabetes mellitus without complications: E11.9

## 2014-05-11 LAB — CBC WITH DIFFERENTIAL/PLATELET
Basophils Absolute: 0 10*3/uL (ref 0.0–0.1)
Basophils Relative: 0 % (ref 0–1)
Eosinophils Absolute: 0.1 10*3/uL (ref 0.0–0.7)
Eosinophils Relative: 1 % (ref 0–5)
HCT: 57.5 % — ABNORMAL HIGH (ref 39.0–52.0)
Hemoglobin: 14.1 g/dL (ref 13.0–17.0)
Lymphocytes Relative: 14 % (ref 12–46)
Lymphs Abs: 1.1 10*3/uL (ref 0.7–4.0)
MCH: 32.6 pg (ref 26.0–34.0)
MCHC: 24.5 g/dL — ABNORMAL LOW (ref 30.0–36.0)
MCV: 133.1 fL — ABNORMAL HIGH (ref 78.0–100.0)
Monocytes Absolute: 0.4 10*3/uL (ref 0.1–1.0)
Monocytes Relative: 5 % (ref 3–12)
Neutro Abs: 6.3 10*3/uL (ref 1.7–7.7)
Neutrophils Relative %: 80 % — ABNORMAL HIGH (ref 43–77)
Platelets: 288 10*3/uL (ref 150–400)
RBC: 4.32 MIL/uL (ref 4.22–5.81)
RDW: 15 % (ref 11.5–15.5)
WBC: 7.9 10*3/uL (ref 4.0–10.5)

## 2014-05-11 LAB — BASIC METABOLIC PANEL
Anion gap: 10 (ref 5–15)
BUN: 15 mg/dL (ref 6–23)
CO2: 25 mmol/L (ref 19–32)
Calcium: 9.3 mg/dL (ref 8.4–10.5)
Chloride: 103 mmol/L (ref 96–112)
Creatinine, Ser: 0.72 mg/dL (ref 0.50–1.35)
GFR calc Af Amer: >90 mL/min (ref >90)
GFR calc non Af Amer: >90 mL/min (ref >90)
Glucose, Bld: 143 mg/dL — ABNORMAL HIGH (ref 70–99)
Potassium: 4.5 mmol/L (ref 3.5–5.1)
Sodium: 138 mmol/L (ref 135–145)

## 2014-05-11 LAB — CBG MONITORING, ED: Glucose-Capillary: 120 mg/dL — ABNORMAL HIGH (ref 70–99)

## 2014-05-11 MED ORDER — SODIUM CHLORIDE 0.9 % IV BOLUS (SEPSIS)
1000.0000 mL | Freq: Once | INTRAVENOUS | Status: AC
  Administered 2014-05-11: 1000 mL via INTRAVENOUS

## 2014-05-11 MED ORDER — MECLIZINE HCL 25 MG PO TABS: 25.0000 mg | ORAL_TABLET | Freq: Three times a day (TID) | ORAL | Status: DC | PRN

## 2014-05-11 MED ORDER — KETOROLAC TROMETHAMINE 15 MG/ML IJ SOLN
15.0000 mg | Freq: Once | INTRAMUSCULAR | Status: AC
  Administered 2014-05-11: 15 mg via INTRAVENOUS
  Filled 2014-05-11: qty 1

## 2014-05-11 MED ORDER — PROCHLORPERAZINE EDISYLATE 5 MG/ML IJ SOLN
10.0000 mg | Freq: Four times a day (QID) | INTRAMUSCULAR | Status: DC | PRN
  Administered 2014-05-11: 10 mg via INTRAVENOUS
  Filled 2014-05-11: qty 2

## 2014-05-11 MED ORDER — MECLIZINE HCL 25 MG PO TABS
25.0000 mg | ORAL_TABLET | Freq: Once | ORAL | Status: AC
  Administered 2014-05-11: 25 mg via ORAL
  Filled 2014-05-11: qty 1

## 2014-05-11 NOTE — ED Notes (Signed)
Bed: WT21 Expected date:  Expected time:  Means of arrival:  Comments: EMS-[facility]

## 2014-05-11 NOTE — ED Notes (Signed)
Upon arrival patient is not agreeable to initial plan of care. He will not allow VS and or monitor to be connected. Pt will not use bags for emesis. He prefers to vomit in sink

## 2014-05-11 NOTE — Discharge Instructions (Signed)
Benign Positional Vertigo Vertigo means you feel like you or your surroundings are moving when they are not. Benign positional vertigo is the most common form of vertigo. Benign means that the cause of your condition is not serious. Benign positional vertigo is more common in older adults. CAUSES  Benign positional vertigo is the result of an upset in the labyrinth system. This is an area in the middle ear that helps control your balance. This may be caused by a viral infection, head injury, or repetitive motion. However, often no specific cause is found. SYMPTOMS  Symptoms of benign positional vertigo occur when you move your head or eyes in different directions. Some of the symptoms may include:  Loss of balance and falls.  Vomiting.  Blurred vision.  Dizziness.  Nausea.  Involuntary eye movements (nystagmus). DIAGNOSIS  Benign positional vertigo is usually diagnosed by physical exam. If the specific cause of your benign positional vertigo is unknown, your caregiver may perform imaging tests, such as magnetic resonance imaging (MRI) or computed tomography (CT). TREATMENT  Your caregiver may recommend movements or procedures to correct the benign positional vertigo. Medicines such as meclizine, benzodiazepines, and medicines for nausea may be used to treat your symptoms. In rare cases, if your symptoms are caused by certain conditions that affect the inner ear, you may need surgery. HOME CARE INSTRUCTIONS   Follow your caregiver's instructions.  Move slowly. Do not make sudden body or head movements.  Avoid driving.  Avoid operating heavy machinery.  Avoid performing any tasks that would be dangerous to you or others during a vertigo episode.  Drink enough fluids to keep your urine clear or pale yellow. SEEK IMMEDIATE MEDICAL CARE IF:   You develop problems with walking, weakness, numbness, or using your arms, hands, or legs.  You have difficulty speaking.  You develop  severe headaches.  Your nausea or vomiting continues or gets worse.  You develop visual changes.  Your family or friends notice any behavioral changes.  Your condition gets worse.  You have a fever.  You develop a stiff neck or sensitivity to light. MAKE SURE YOU:   Understand these instructions.  Will watch your condition.  Will get help right away if you are not doing well or get worse. Document Released: 11/06/2005 Document Revised: 04/23/2011 Document Reviewed: 10/19/2010 ExitCare Patient Information 2015 [employer]. This information is not intended to replace advice given to you by your health care provider. Make sure you discuss any questions you have with your health care provider.    

## 2014-05-11 NOTE — ED Notes (Signed)
Pt is threatening to leave ama.

## 2014-05-11 NOTE — ED Notes (Signed)
Ems contacted by patients mother whom reports he was "laying out" on the floor. Pt c/o dizziness and H/A, pt A&ox4.   BP 180/120 at EMS first arrival then decreased 150/98, 98%ra. Pts mother feels he neds home care to assit with medication management. Pt stated that he is not compliant with medications.

## 2014-05-13 NOTE — ED Provider Notes (Signed)
CSN: [id number]     Arrival date & time 05/11/14  5284 History   First MD Initiated Contact with Patient 05/11/14 [phone]     Chief Complaint  Patient presents with  . Dizziness     (Consider location/radiation/quality/duration/timing/severity/associated sxs/prior Treatment) HPI   39 year old male with dizziness, nausea and vomiting. Patient describes sensation of the room spinning. Feels very unsteady at times. Worse with movements. Nausea and has vomited. At one point he had to lay on the ground because he felt so off balance. No acute pain. Denies tinnitus. No fever or chills. No SOB.   Past Medical History  Diagnosis Date  . Hypertension   . Mental disorder     border line bipolar  . Anxiety   . Depression   . Mild obesity   . Seizures     Intractible  . Suicide and self-inflicted injury   . Restless legs syndrome (RLS) 11/06/2012    Right leg  . Diabetes mellitus without complication    Past Surgical History  Procedure Laterality Date  . Burn debridement surgery    . Vagus nerve stimulator insertion    . Joint replacement      rotator cuff surgery   lt  . Direct laryngoscopy with radiaesse injection  04/11/2011    Procedure: DIRECT LARYNGOSCOPY WITH RADIAESSE INJECTION;  Surgeon: Melida Quitter, MD;  Location: [facility];  Service: ENT;  Laterality: N/A;  MICRO DIRECT LARYNGOSCOPY WITH LEFT VOCAL FOLD RADIESSE INJECTION/JET VENTURI VENTILATION  . Shoulder surgery N/A    Family History  Problem Relation Age of Onset  . Diabetes Mother   . ALS Maternal Aunt   . Cancer Maternal Uncle    History  Substance Use Topics  . Smoking status: Former Research scientist (life sciences)  . Smokeless tobacco: Never Used     Comment: Quit when he was 39 years old  . Alcohol Use: No    Review of Systems  All systems reviewed and negative, other than as noted in HPI.   Allergies  Review of patient's allergies indicates no known allergies.  Home Medications   Prior to Admission medications   Medication Sig  Start Date End Date Taking? Authorizing Provider  amLODipine (NORVASC) 5 MG tablet TAKE ONE TABLET BY MOUTH DAILY 04/12/14  Yes Kathrynn Ducking, MD  DILANTIN 100 MG ER capsule TAKE 3 CAPSULES (300 MG TOTAL) BY MOUTH 2 (TWO) TIMES DAILY. 04/12/14  Yes Kathrynn Ducking, MD  escitalopram (LEXAPRO) 20 MG tablet TAKE ONE TABLET BY MOUTH DAILY 12/09/13  Yes Kathrynn Ducking, MD  lacosamide (VIMPAT) 200 MG TABS tablet Take 1 tablet (200 mg total) by mouth 2 (two) times daily. 11/26/13  Yes Philmore Pali, NP  lamoTRIgine (LAMICTAL) 200 MG tablet Take 2 tablets (400 mg total) by mouth 2 (two) times daily. 11/26/13  Yes Philmore Pali, NP  lisinopril (PRINIVIL,ZESTRIL) 10 MG tablet TAKE 1 TABLET BY MOUTH EVERY DAY 04/12/14  Yes Kathrynn Ducking, MD  lithium 300 MG tablet TAKE 1 TABLET (300 MG TOTAL) BY MOUTH 3 (THREE) TIMES DAILY WITH MEALS. 04/12/14  Yes Kathrynn Ducking, MD  metFORMIN (GLUCOPHAGE) 500 MG tablet Take 1,000 mg by mouth 2 (two) times daily with a meal.  11/23/13  Yes Historical Provider, MD  pramipexole (MIRAPEX) 0.75 MG tablet TAKE 1 TABLET (0.75 MG TOTAL) BY MOUTH AT BEDTIME. 04/12/14  Yes Kathrynn Ducking, MD  pravastatin (PRAVACHOL) 40 MG tablet Take 40 mg by mouth daily.   Yes Historical Provider,  MD  zolpidem (AMBIEN) 10 MG tablet Take 1 tablet (10 mg total) by mouth at bedtime as needed for sleep. 04/06/14  Yes Kathrynn Ducking, MD  Cholecalciferol ([employer]S VITAMIN D3) 1000 UNITS capsule TAKE 1 CAPSULE BY MOUTH TWICE DAILY. 11/26/13   Philmore Pali, NP  ibuprofen (ADVIL,MOTRIN) 800 MG tablet Take 800 mg by mouth every 8 (eight) hours as needed for mild pain.    Historical Provider, MD  LORazepam (ATIVAN) 1 MG tablet Take 1 tablet (1 mg total) by mouth 2 (two) times daily. 08/24/13   Freddi Che, MD  meclizine (ANTIVERT) 25 MG tablet Take 1 tablet (25 mg total) by mouth 3 (three) times daily as needed for dizziness. 05/11/14   Virgel Manifold, MD  ofloxacin (FLOXIN) 0.3 % otic solution  09/02/13   Historical  Provider, MD  OVER THE COUNTER MEDICATION Take 1 tablet by mouth 2 (two) times daily. Bone Density Medication    Historical Provider, MD  pramipexole (MIRAPEX) 0.75 MG tablet TAKE 1 TABLET BY MOUTH EVERY NIGHT AT BEDTIME. Patient not taking: Reported on 05/11/2014 04/12/14   Kathrynn Ducking, MD   BP 126/62 mmHg  Pulse 78  Temp(Src) 97.5 F (36.4 C) (Oral)  Resp 20  SpO2 98% Physical Exam  Constitutional: He is oriented to person, place, and time. He appears well-developed and well-nourished. No distress.  HENT:  Head: Normocephalic and atraumatic.  Eyes: Conjunctivae are normal. Pupils are equal, round, and reactive to light. Right eye exhibits no discharge. Left eye exhibits no discharge.  Left horizontal nystagmus.  Neck: Neck supple.  Cardiovascular: Normal rate, regular rhythm and normal heart sounds.  Exam reveals no gallop and no friction rub.   No murmur heard. Pulmonary/Chest: Effort normal and breath sounds normal. No respiratory distress.  Abdominal: Soft. He exhibits no distension. There is no tenderness.  Musculoskeletal: He exhibits no edema or tenderness.  Neurological: He is alert and oriented to person, place, and time. No cranial nerve deficit. He exhibits normal muscle tone. Coordination normal.  Good finger to nose testing bilaterally. Gait is steady.  Skin: Skin is warm and dry.  Psychiatric: He has a normal mood and affect. His behavior is normal. Thought content normal.  Nursing note and vitals reviewed.   ED Course  Procedures (including critical care time) Labs Review Labs Reviewed  CBC WITH DIFFERENTIAL/PLATELET - Abnormal; Notable for the following:    HCT 57.5 (*)    MCV 133.1 (*)    MCHC 24.5 (*)    Neutrophils Relative % 80 (*)    All other components within normal limits  BASIC METABOLIC PANEL - Abnormal; Notable for the following:    Glucose, Bld 143 (*)    All other components within normal limits  CBG MONITORING, ED - Abnormal; Notable for  the following:    Glucose-Capillary 120 (*)    All other components within normal limits    Imaging Review No results found.   EKG Interpretation None      MDM   Final diagnoses:  Vertigo    39 year old male with vertigo. Likely peripheral etiology. Positional. Reproducible. Fatigable. Nonfocal neurological examination. Doubt central etiology.    Virgel Manifold, MD 05/13/14 [phone]

## 2014-05-18 ENCOUNTER — Other Ambulatory Visit: Payer: Self-pay | Admitting: Nurse Practitioner

## 2014-05-19 NOTE — Telephone Encounter (Signed)
Rx was faxed.

## 2014-05-24 DIAGNOSIS — E119 Type 2 diabetes mellitus without complications: Secondary | ICD-10-CM | POA: Diagnosis not present

## 2014-05-24 DIAGNOSIS — Z6841 Body Mass Index (BMI) 40.0 and over, adult: Secondary | ICD-10-CM | POA: Diagnosis not present

## 2014-05-24 DIAGNOSIS — I10 Essential (primary) hypertension: Secondary | ICD-10-CM | POA: Diagnosis not present

## 2014-05-24 DIAGNOSIS — E780 Pure hypercholesterolemia: Secondary | ICD-10-CM | POA: Diagnosis not present

## 2014-05-25 ENCOUNTER — Ambulatory Visit (INDEPENDENT_AMBULATORY_CARE_PROVIDER_SITE_OTHER): Payer: Medicare Other | Admitting: Neurology

## 2014-05-25 ENCOUNTER — Encounter: Payer: Self-pay | Admitting: Neurology

## 2014-05-25 VITALS — BP 160/84 | HR 83 | Ht 67.75 in | Wt 263.4 lb

## 2014-05-25 DIAGNOSIS — G2581 Restless legs syndrome: Secondary | ICD-10-CM | POA: Diagnosis not present

## 2014-05-25 DIAGNOSIS — H8110 Benign paroxysmal vertigo, unspecified ear: Secondary | ICD-10-CM

## 2014-05-25 DIAGNOSIS — G40311 Generalized idiopathic epilepsy and epileptic syndromes, intractable, with status epilepticus: Secondary | ICD-10-CM

## 2014-05-25 DIAGNOSIS — Z5181 Encounter for therapeutic drug level monitoring: Secondary | ICD-10-CM

## 2014-05-25 DIAGNOSIS — G40319 Generalized idiopathic epilepsy and epileptic syndromes, intractable, without status epilepticus: Secondary | ICD-10-CM

## 2014-05-25 MED ORDER — MECLIZINE HCL 25 MG PO TABS: 25.0000 mg | ORAL_TABLET | Freq: Three times a day (TID) | ORAL | Status: DC | PRN

## 2014-05-25 NOTE — Patient Instructions (Signed)

## 2014-05-25 NOTE — Progress Notes (Signed)
Reason for visit: Seizures  Eric Fernandez is an 39 y.o. male  History of present illness:  Eric Fernandez is a 39 year old right-handed white male with a history of intractable seizures. The patient has a vagal nerve stimulator in place, and he is tolerating this relatively well. He believes that this has offered some benefit with his seizure control. The patient last had a seizure event on 01/21/2014. The patient has episodes of "phasing out" that may occur at times, sometimes this is related to missing a dose of medications. The patient has a pill dispenser, and he tries to keep up with his medications, but occasionally he may miss a dose. The patient recently was in the emergency room with an episode of vertigo on 05/11/2014. The patient indicates that he had nausea and vomiting with this, but the vertigo has improved at this point. He has restless leg syndrome, he is sleeping fairly well with medications. He has significant problem with depression and bipolar disorder, he is on lithium currently. He returns to this office for an evaluation. Previously, his mother had contacted our office, requesting some aid and assistance in the home, and this was ordered, but the patient himself has not heard anything about this referral.  Past Medical History  Diagnosis Date  . Hypertension   . Mental disorder     border line bipolar  . Anxiety   . Depression   . Mild obesity   . Seizures     Intractible  . Suicide and self-inflicted injury   . Restless legs syndrome (RLS) 11/06/2012    Right leg  . Diabetes mellitus without complication   . Vertigo     Past Surgical History  Procedure Laterality Date  . Burn debridement surgery    . Vagus nerve stimulator insertion    . Joint replacement      rotator cuff surgery   lt  . Direct laryngoscopy with radiaesse injection  04/11/2011    Procedure: DIRECT LARYNGOSCOPY WITH RADIAESSE INJECTION;  Surgeon: Melida Quitter, MD;  Location: [facility];  Service:  ENT;  Laterality: N/A;  MICRO DIRECT LARYNGOSCOPY WITH LEFT VOCAL FOLD RADIESSE INJECTION/JET VENTURI VENTILATION  . Shoulder surgery N/A     Family History  Problem Relation Age of Onset  . Diabetes Mother   . ALS Maternal Aunt   . Cancer Maternal Uncle     Social history:  reports that he has quit smoking. He has never used smokeless tobacco. He reports that he does not drink alcohol or use illicit drugs.   No Known Allergies  Medications:  Prior to Admission medications   Medication Sig Start Date End Date Taking? Authorizing Provider  amLODipine (NORVASC) 5 MG tablet TAKE ONE TABLET BY MOUTH DAILY 04/12/14  Yes Kathrynn Ducking, MD  Cholecalciferol ([facility] VITAMIN D3) 1000 UNITS capsule TAKE 1 CAPSULE BY MOUTH TWICE DAILY. 11/26/13  Yes Philmore Pali, NP  DILANTIN 100 MG ER capsule TAKE 3 CAPSULES (300 MG TOTAL) BY MOUTH 2 (TWO) TIMES DAILY. 04/12/14  Yes Kathrynn Ducking, MD  escitalopram (LEXAPRO) 20 MG tablet TAKE ONE TABLET BY MOUTH DAILY 12/09/13  Yes Kathrynn Ducking, MD  ibuprofen (ADVIL,MOTRIN) 800 MG tablet Take 800 mg by mouth every 8 (eight) hours as needed for mild pain.   Yes Historical Provider, MD  lamoTRIgine (LAMICTAL) 200 MG tablet Take 2 tablets (400 mg total) by mouth 2 (two) times daily. 11/26/13  Yes Philmore Pali, NP  lisinopril (PRINIVIL,ZESTRIL) 10  MG tablet TAKE 1 TABLET BY MOUTH EVERY DAY 04/12/14  Yes Kathrynn Ducking, MD  lithium 300 MG tablet TAKE 1 TABLET (300 MG TOTAL) BY MOUTH 3 (THREE) TIMES DAILY WITH MEALS. 04/12/14  Yes Kathrynn Ducking, MD  LORazepam (ATIVAN) 1 MG tablet Take 1 tablet (1 mg total) by mouth 2 (two) times daily. 08/24/13  Yes Freddi Che, MD  meclizine (ANTIVERT) 25 MG tablet Take 1 tablet (25 mg total) by mouth 3 (three) times daily as needed for dizziness. 05/11/14  Yes Virgel Manifold, MD  metFORMIN (GLUCOPHAGE) 500 MG tablet Take 1,000 mg by mouth 2 (two) times daily with a meal.  11/23/13  Yes Historical Provider, MD  ofloxacin (FLOXIN) 0.3 %  otic solution  09/02/13  Yes Historical Provider, MD  OVER THE COUNTER MEDICATION Take 1 tablet by mouth 2 (two) times daily. Bone Density Medication   Yes Historical Provider, MD  pramipexole (MIRAPEX) 0.75 MG tablet TAKE 1 TABLET (0.75 MG TOTAL) BY MOUTH AT BEDTIME. 04/12/14  Yes Kathrynn Ducking, MD  pramipexole (MIRAPEX) 0.75 MG tablet TAKE 1 TABLET BY MOUTH EVERY NIGHT AT BEDTIME. 04/12/14  Yes Kathrynn Ducking, MD  pravastatin (PRAVACHOL) 40 MG tablet Take 40 mg by mouth daily.   Yes Historical Provider, MD  VIMPAT 200 MG TABS tablet TAKE 1 TABLET BY MOUTH TWICE A DAY 05/18/14  Yes Kathrynn Ducking, MD  zolpidem (AMBIEN) 10 MG tablet Take 1 tablet (10 mg total) by mouth at bedtime as needed for sleep. 04/06/14  Yes Kathrynn Ducking, MD    ROS:  Out of a complete 14 system review of symptoms, the patient complains only of the following symptoms, and all other reviewed systems are negative.  Memory loss Headache, seizures, tremor Depression Restless legs  Blood pressure 160/84, pulse 83, height 5' 7.75" (1.721 m), weight 263 lb 6.4 oz (119.477 kg).  Physical Exam  General: The patient is alert and cooperative at the time of the examination. The patient is moderately obese.  Skin: No significant peripheral edema is noted.   Neurologic Exam  Mental status: The patient is alert and oriented x 3 at the time of the examination. The patient has apparent normal recent and remote memory, with an apparently normal attention span and concentration ability.   Cranial nerves: Facial symmetry is present. Speech is normal, no aphasia or dysarthria is noted. Extraocular movements are full. Visual fields are full.  Motor: The patient has good strength in all 4 extremities.  Sensory examination: Soft touch sensation is symmetric on the face, arms, and legs.  Coordination: The patient has good finger-nose-finger and heel-to-shin bilaterally.  Gait and station: The patient has a normal gait.  Tandem gait is normal. Romberg is negative. No drift is seen.  Reflexes: Deep tendon reflexes are symmetric.   The vagal nerve stimulator was interrogated today, no changes were made to the settings.  VAGAL NERVE STIMULATOR SETTINGS:  Output current: 150 milliamps  Signal frequency: 30 Hz  Pulse width: 250 microseconds  On time: 30 seconds  Off time: 1.1 minutes  Magnet current: 2.0 milliamps  Magnet on time: 60 seconds  Pulse width: 250 microseconds  Activations: 109  Output current: 1.5 milliamps  Lead impedance: 2865 Ohms  Lead impedance: OK  IFI: No   Assessment/Plan:  1. Intractable seizure disorder  2. Diabetes  3. Restless leg syndrome  4. Depression  5. Recent episode of vertigo  The patient will be given a prescription for meclizine if  he has recurrence of vertigo. The patient will be sent for blood work today to evaluate the Dilantin level, last level was low, but the patient has been missing some doses. He has been doing relatively well with the seizures, fortunately. He is having ongoing problems with depression, and social isolation. I will try to get home health nursing out to his house to evaluate his medications to help improve his medical noncompliance. He will follow-up in about 6 months.  Jill Alexanders MD 05/25/2014 8:18 PM  [facility] [address] [place], [place] [address]  Phone [phone] Fax [phone]

## 2014-05-27 ENCOUNTER — Telehealth: Payer: Self-pay

## 2014-05-27 LAB — COMPREHENSIVE METABOLIC PANEL
ALK PHOS: 119 IU/L — AB (ref 39–117)
ALT: 29 IU/L (ref 0–44)
AST: 18 IU/L (ref 0–40)
Albumin/Globulin Ratio: 2.5 (ref 1.1–2.5)
Albumin: 4.8 g/dL (ref 3.5–5.5)
BUN/Creatinine Ratio: 23 — ABNORMAL HIGH (ref 8–19)
BUN: 14 mg/dL (ref 6–20)
Bilirubin Total: <0.2 mg/dL (ref 0.0–1.2)
CO2: 24 mmol/L (ref 18–29)
CREATININE: 0.62 mg/dL — AB (ref 0.76–1.27)
Calcium: 9.7 mg/dL (ref 8.7–10.2)
Chloride: 97 mmol/L (ref 97–108)
GFR calc Af Amer: 145 mL/min/{1.73_m2} (ref >59)
GFR calc non Af Amer: 126 mL/min/{1.73_m2} (ref >59)
GLOBULIN, TOTAL: 1.9 g/dL (ref 1.5–4.5)
Glucose: 144 mg/dL — ABNORMAL HIGH (ref 65–99)
Potassium: 5.4 mmol/L — ABNORMAL HIGH (ref 3.5–5.2)
Sodium: 139 mmol/L (ref 134–144)
Total Protein: 6.7 g/dL (ref 6.0–8.5)

## 2014-05-27 LAB — CBC WITH DIFFERENTIAL/PLATELET
BASOS ABS: 0 10*3/uL (ref 0.0–0.2)
Basos: 1 %
EOS ABS: 0.1 10*3/uL (ref 0.0–0.4)
Eos: 2 %
HCT: 42.8 % (ref 37.5–51.0)
HEMOGLOBIN: 14.4 g/dL (ref 12.6–17.7)
Immature Grans (Abs): 0 10*3/uL (ref 0.0–0.1)
Immature Granulocytes: 0 %
LYMPHS: 20 %
Lymphocytes Absolute: 1.5 10*3/uL (ref 0.7–3.1)
MCH: 31.4 pg (ref 26.6–33.0)
MCHC: 33.6 g/dL (ref 31.5–35.7)
MCV: 93 fL (ref 79–97)
MONOS ABS: 0.5 10*3/uL (ref 0.1–0.9)
Monocytes: 6 %
NEUTROS PCT: 71 %
Neutrophils Absolute: 5.3 10*3/uL (ref 1.4–7.0)
Platelets: 287 10*3/uL (ref 150–379)
RBC: 4.58 x10E6/uL (ref 4.14–5.80)
RDW: 12.9 % (ref 12.3–15.4)
WBC: 7.4 10*3/uL (ref 3.4–10.8)

## 2014-05-27 LAB — PHENYTOIN LEVEL, TOTAL: PHENYTOIN LVL: 11 ug/mL (ref 10.0–20.0)

## 2014-05-27 LAB — LAMOTRIGINE LEVEL: Lamotrigine Lvl: 10 ug/mL (ref 2.0–20.0)

## 2014-05-27 LAB — LITHIUM LEVEL: Lithium Lvl: 0.5 mmol/L — ABNORMAL LOW (ref 0.6–1.4)

## 2014-05-27 NOTE — Telephone Encounter (Signed)
I called the patient and relayed the results.

## 2014-05-27 NOTE — Telephone Encounter (Signed)
-----  Message from Kathrynn Ducking, MD sent at 05/27/2014  2:44 PM EDT ----- Blood work is relatively unremarkable. Blood sugar is slightly elevated, alk phosphatase is minimally elevated, good levels of Dilantin and lamotrigine. Lithium level is slightly low. Blood count is unremarkable. No change in medical therapy. Please call the patient.  ----- Message -----    From: [facility] Lab Results In Interface    Sent: 05/26/2014   7:52 AM      To: Kathrynn Ducking, MD

## 2014-06-15 ENCOUNTER — Other Ambulatory Visit: Payer: Self-pay | Admitting: Neurology

## 2014-06-28 ENCOUNTER — Telehealth: Payer: Self-pay | Admitting: Neurology

## 2014-06-28 ENCOUNTER — Other Ambulatory Visit: Payer: Self-pay | Admitting: Nurse Practitioner

## 2014-06-28 NOTE — Telephone Encounter (Signed)
[facility] from [facility] calling regard a refill request for DILANTIN 100 MG ER capsule. Jimmye Norman can be reached @ [phone]

## 2014-06-28 NOTE — Telephone Encounter (Signed)
Rx was already sent.  Receipt confirmed by pharmacy.

## 2014-07-28 ENCOUNTER — Other Ambulatory Visit: Payer: Self-pay | Admitting: Neurology

## 2014-07-30 ENCOUNTER — Ambulatory Visit (INDEPENDENT_AMBULATORY_CARE_PROVIDER_SITE_OTHER): Payer: Medicare Other | Admitting: Neurology

## 2014-07-30 ENCOUNTER — Encounter: Payer: Self-pay | Admitting: Neurology

## 2014-07-30 VITALS — BP 123/72 | HR 83 | Ht 68.0 in | Wt 259.0 lb

## 2014-07-30 DIAGNOSIS — G40319 Generalized idiopathic epilepsy and epileptic syndromes, intractable, without status epilepticus: Secondary | ICD-10-CM

## 2014-07-30 DIAGNOSIS — H8110 Benign paroxysmal vertigo, unspecified ear: Secondary | ICD-10-CM

## 2014-07-30 DIAGNOSIS — G2581 Restless legs syndrome: Secondary | ICD-10-CM | POA: Diagnosis not present

## 2014-07-30 DIAGNOSIS — G40311 Generalized idiopathic epilepsy and epileptic syndromes, intractable, with status epilepticus: Secondary | ICD-10-CM | POA: Diagnosis not present

## 2014-07-30 NOTE — Patient Instructions (Signed)

## 2014-07-30 NOTE — Progress Notes (Signed)
Reason for visit: Seizures  Eric Fernandez is an 39 y.o. male  History of present illness:  Eric Fernandez is a 39 year old right handed white male with a history of intractable epilepsy. The patient has episodes of "phasing out" at least 3 times a week. The patient has not had a generalized seizure since December 2015. He is tolerating his medications fairly well. At times, he indicates that he cannot sleep well because of the cycling of the vagal nerve stimulator. Occasionally, he may put a magnet on the stimulator so that he can get to sleep. He is otherwise tolerating the stimulator during the daytime. He denies any other new medical issues that have come up since last seen. He does have restless leg syndrome, and the medications he is taking is controlling this fairly well.  Past Medical History  Diagnosis Date  . Hypertension   . Mental disorder     border line bipolar  . Anxiety   . Depression   . Mild obesity   . Seizures     Intractible  . Suicide and self-inflicted injury   . Restless legs syndrome (RLS) 11/06/2012    Right leg  . Diabetes mellitus without complication   . Vertigo     Past Surgical History  Procedure Laterality Date  . Burn debridement surgery    . Vagus nerve stimulator insertion    . Joint replacement      rotator cuff surgery   lt  . Direct laryngoscopy with radiaesse injection  04/11/2011    Procedure: DIRECT LARYNGOSCOPY WITH RADIAESSE INJECTION;  Surgeon: Melida Quitter, MD;  Location: [facility];  Service: ENT;  Laterality: N/A;  MICRO DIRECT LARYNGOSCOPY WITH LEFT VOCAL FOLD RADIESSE INJECTION/JET VENTURI VENTILATION  . Shoulder surgery N/A     Family History  Problem Relation Age of Onset  . Diabetes Mother   . ALS Maternal Aunt   . Cancer Maternal Uncle     Social history:  reports that he has quit smoking. He has never used smokeless tobacco. He reports that he does not drink alcohol or use illicit drugs.   No Known Allergies  Medications:    Prior to Admission medications   Medication Sig Start Date End Date Taking? Authorizing Provider  amLODipine (NORVASC) 5 MG tablet TAKE ONE TABLET BY MOUTH DAILY 04/12/14  Yes Kathrynn Ducking, MD  Cholecalciferol ([facility] VITAMIN D3) 1000 UNITS capsule TAKE 1 CAPSULE BY MOUTH TWICE DAILY. 11/26/13  Yes Philmore Pali, NP  DILANTIN 100 MG ER capsule TAKE 3 CAPSULES (300 MG TOTAL) BY MOUTH 2 (TWO) TIMES DAILY. 06/28/14  Yes Kathrynn Ducking, MD  escitalopram (LEXAPRO) 20 MG tablet TAKE 1 TABLET BY MOUTH EVERY DAY 06/15/14  Yes Kathrynn Ducking, MD  ibuprofen (ADVIL,MOTRIN) 800 MG tablet Take 800 mg by mouth every 8 (eight) hours as needed for mild pain.   Yes Historical Provider, MD  lamoTRIgine (LAMICTAL) 200 MG tablet Take 2 tablets (400 mg total) by mouth 2 (two) times daily. Patient taking differently: Take 200 mg by mouth 2 (two) times daily.  11/26/13  Yes Philmore Pali, NP  lisinopril (PRINIVIL,ZESTRIL) 10 MG tablet TAKE 1 TABLET BY MOUTH EVERY DAY 04/12/14  Yes Kathrynn Ducking, MD  lithium 300 MG tablet TAKE 1 TABLET (300 MG TOTAL) BY MOUTH 3 (THREE) TIMES DAILY WITH MEALS. 04/12/14  Yes Kathrynn Ducking, MD  LORazepam (ATIVAN) 1 MG tablet Take 1 tablet (1 mg total) by mouth  2 (two) times daily. 08/24/13  Yes Freddi Che, MD  meclizine (ANTIVERT) 25 MG tablet Take 1 tablet (25 mg total) by mouth 3 (three) times daily as needed for dizziness. 05/11/14  Yes Virgel Manifold, MD  metFORMIN (GLUCOPHAGE) 500 MG tablet Take 1,000 mg by mouth 2 (two) times daily with a meal.  11/23/13  Yes Historical Provider, MD  [place] Take 1 tablet by mouth 2 (two) times daily. Bone Density Medication   Yes Historical Provider, MD  pramipexole (MIRAPEX) 0.75 MG tablet TAKE 1 TABLET BY MOUTH EVERY NIGHT AT BEDTIME. 07/29/14  Yes Kathrynn Ducking, MD  pravastatin (PRAVACHOL) 40 MG tablet Take 40 mg by mouth daily.   Yes Historical Provider, MD  VIMPAT 200 MG TABS tablet TAKE 1 TABLET BY MOUTH TWICE A DAY 05/18/14   Yes Kathrynn Ducking, MD  zolpidem (AMBIEN) 10 MG tablet Take 1 tablet (10 mg total) by mouth at bedtime as needed for sleep. 04/06/14  Yes Kathrynn Ducking, MD  LAMICTAL 100 MG tablet Take 100 mg by mouth 2 (two) times daily. 06/30/14   Historical Provider, MD    ROS:  Out of a complete 14 system review of symptoms, the patient complains only of the following symptoms, and all other reviewed systems are negative.  Weight gain, fatigue Memory loss, confusion, seizures, tremor Depression, anxiety, too much sleep, decreased energy Restless legs  Blood pressure 123/72, pulse 83, height 5\' 8"  (1.727 m), weight 259 lb (117.482 kg).  Physical Exam  General: The patient is alert and cooperative at the time of the examination. The patient is moderately obese.  Skin: No significant peripheral edema is noted.   Neurologic Exam  Mental status: The patient is alert and oriented x 3 at the time of the examination. The patient has apparent normal recent and remote memory, with an apparently normal attention span and concentration ability.   Cranial nerves: Facial symmetry is present. Speech is normal, no aphasia or dysarthria is noted. Extraocular movements are full. Visual fields are full.  Motor: The patient has good strength in all 4 extremities.  Sensory examination: Soft touch sensation is symmetric on the face, arms, and legs.  Coordination: The patient has good finger-nose-finger and heel-to-shin bilaterally.  Gait and station: The patient has a normal gait. Tandem gait is normal. Romberg is negative. No drift is seen.  Reflexes: Deep tendon reflexes are symmetric.   Assessment/Plan:  1. Intractable epilepsy  2. Restless leg syndrome  The patient will continue his current medications, we will follow-up in about 6 months. If the vagal nerve stimulator continues to be a problem with his sleep, we may have to alter the settings.   Jill Alexanders MD 08/02/2014 9:28 PM  [facility] [address] [place], [place] [address]  Phone [phone] Fax [phone]

## 2014-08-22 ENCOUNTER — Telehealth: Payer: Self-pay | Admitting: Neurology

## 2014-08-22 ENCOUNTER — Encounter (HOSPITAL_COMMUNITY): Payer: Self-pay | Admitting: Emergency Medicine

## 2014-08-22 ENCOUNTER — Emergency Department (HOSPITAL_COMMUNITY)
Admission: EM | Admit: 2014-08-22 | Discharge: 2014-08-22 | Disposition: A | Discharge status: Home / Self Care | Payer: Medicare Other | Attending: Emergency Medicine | Admitting: Emergency Medicine

## 2014-08-22 DIAGNOSIS — Z87891 Personal history of nicotine dependence: Secondary | ICD-10-CM | POA: Insufficient documentation

## 2014-08-22 DIAGNOSIS — E669 Obesity, unspecified: Secondary | ICD-10-CM | POA: Insufficient documentation

## 2014-08-22 DIAGNOSIS — I10 Essential (primary) hypertension: Secondary | ICD-10-CM | POA: Diagnosis not present

## 2014-08-22 DIAGNOSIS — Z79899 Other long term (current) drug therapy: Secondary | ICD-10-CM | POA: Diagnosis not present

## 2014-08-22 DIAGNOSIS — F329 Major depressive disorder, single episode, unspecified: Secondary | ICD-10-CM | POA: Insufficient documentation

## 2014-08-22 DIAGNOSIS — G40909 Epilepsy, unspecified, not intractable, without status epilepticus: Secondary | ICD-10-CM | POA: Insufficient documentation

## 2014-08-22 DIAGNOSIS — F419 Anxiety disorder, unspecified: Secondary | ICD-10-CM | POA: Insufficient documentation

## 2014-08-22 DIAGNOSIS — E119 Type 2 diabetes mellitus without complications: Secondary | ICD-10-CM | POA: Diagnosis not present

## 2014-08-22 DIAGNOSIS — R569 Unspecified convulsions: Secondary | ICD-10-CM

## 2014-08-22 LAB — CBC
HEMATOCRIT: 42.2 % (ref 39.0–52.0)
HEMOGLOBIN: 14.3 g/dL (ref 13.0–17.0)
MCH: 31 pg (ref 26.0–34.0)
MCHC: 33.9 g/dL (ref 30.0–36.0)
MCV: 91.3 fL (ref 78.0–100.0)
Platelets: 241 10*3/uL (ref 150–400)
RBC: 4.62 MIL/uL (ref 4.22–5.81)
RDW: 12.4 % (ref 11.5–15.5)
WBC: 7.8 10*3/uL (ref 4.0–10.5)

## 2014-08-22 LAB — BASIC METABOLIC PANEL
Anion gap: 12 (ref 5–15)
BUN: 10 mg/dL (ref 6–20)
CO2: 23 mmol/L (ref 22–32)
Calcium: 8.7 mg/dL — ABNORMAL LOW (ref 8.9–10.3)
Chloride: 101 mmol/L (ref 101–111)
Creatinine, Ser: 0.73 mg/dL (ref 0.61–1.24)
GFR calc Af Amer: >60 mL/min (ref >60)
Glucose, Bld: 208 mg/dL — ABNORMAL HIGH (ref 65–99)
POTASSIUM: 4.2 mmol/L (ref 3.5–5.1)
SODIUM: 136 mmol/L (ref 135–145)

## 2014-08-22 LAB — CBG MONITORING, ED: GLUCOSE-CAPILLARY: 191 mg/dL — AB (ref 65–99)

## 2014-08-22 LAB — PHENYTOIN LEVEL, TOTAL: PHENYTOIN LVL: 9.6 ug/mL — AB (ref 10.0–20.0)

## 2014-08-22 MED ORDER — SODIUM CHLORIDE 0.9 % IV BOLUS (SEPSIS)
1000.0000 mL | Freq: Once | INTRAVENOUS | Status: AC
  Administered 2014-08-22: 1000 mL via INTRAVENOUS

## 2014-08-22 MED ORDER — SODIUM CHLORIDE 0.9 % IV BOLUS (SEPSIS): 1000.0000 mL | Freq: Once | INTRAVENOUS | Status: DC

## 2014-08-22 NOTE — Telephone Encounter (Signed)
The patient recently had a seizure, went to the ER. We will get a RV. Dilantin level was low, may need a dose adjustment.

## 2014-08-22 NOTE — ED Notes (Signed)
Seizure pads applied. Pt states he had seizure while at neighbor's house, reports he is unsure if he took seizure medications this morning. He is alert and oriented x4. States injury to tongue, no signs of distress noted.

## 2014-08-22 NOTE — ED Provider Notes (Signed)
CSN: [id number]     Arrival date & time 08/22/14  1349 History   First MD Initiated Contact with Patient 08/22/14 1356     Chief Complaint  Patient presents with  . Seizures     (Consider location/radiation/quality/duration/timing/severity/associated sxs/prior Treatment) HPI Comments: 39 year old male presenting to the EMS after having a witnessed seizure. Patient reports he walked over to his neighbor's house for a Band-Aid when he had a seizure. No aura. He does not remember the seizure activity. According to EMS, the neighbor described a tonic-clonic seizure. Unknown how long the seizure lasted. He was post ictal on EMS arrival. He believes he bit his tongue since it feels sore. Patient states he is compliant with Lamictal and Dilantin and has not missed any doses. His last generalized seizure was in December 2015. Denies recent illness. Denies fever, chills, nausea, vomiting, headache, confusion, dizziness, vision change, chest pain, shortness of breath. Has been drinking many sugary drinks recently including coca-cola and "sugary energy drinks".  The history is provided by the patient and the EMS personnel.    Past Medical History  Diagnosis Date  . Hypertension   . Mental disorder     border line bipolar  . Anxiety   . Depression   . Mild obesity   . Seizures     Intractible  . Suicide and self-inflicted injury   . Restless legs syndrome (RLS) 11/06/2012    Right leg  . Diabetes mellitus without complication   . Vertigo    Past Surgical History  Procedure Laterality Date  . Burn debridement surgery    . Vagus nerve stimulator insertion    . Joint replacement      rotator cuff surgery   lt  . Direct laryngoscopy with radiaesse injection  04/11/2011    Procedure: DIRECT LARYNGOSCOPY WITH RADIAESSE INJECTION;  Surgeon: Melida Quitter, MD;  Location: [facility];  Service: ENT;  Laterality: N/A;  MICRO DIRECT LARYNGOSCOPY WITH LEFT VOCAL FOLD RADIESSE INJECTION/JET VENTURI VENTILATION   . Shoulder surgery N/A    Family History  Problem Relation Age of Onset  . Diabetes Mother   . ALS Maternal Aunt   . Cancer Maternal Uncle    History  Substance Use Topics  . Smoking status: Former Research scientist (life sciences)  . Smokeless tobacco: Never Used     Comment: Quit when he was 39 years old  . Alcohol Use: No    Review of Systems  Neurological: Positive for seizures.  All other systems reviewed and are negative.     Allergies  Review of patient's allergies indicates no known allergies.  Home Medications   Prior to Admission medications   Medication Sig Start Date End Date Taking? Authorizing Provider  amLODipine (NORVASC) 5 MG tablet TAKE ONE TABLET BY MOUTH DAILY 04/12/14  Yes Kathrynn Ducking, MD  Cholecalciferol ([facility] VITAMIN D3) 1000 UNITS capsule TAKE 1 CAPSULE BY MOUTH TWICE DAILY. 11/26/13  Yes Philmore Pali, NP  DILANTIN 100 MG ER capsule TAKE 3 CAPSULES (300 MG TOTAL) BY MOUTH 2 (TWO) TIMES DAILY. 06/28/14  Yes Kathrynn Ducking, MD  escitalopram (LEXAPRO) 20 MG tablet TAKE 1 TABLET BY MOUTH EVERY DAY 06/15/14  Yes Kathrynn Ducking, MD  ibuprofen (ADVIL,MOTRIN) 800 MG tablet Take 800 mg by mouth every 8 (eight) hours as needed for mild pain.   Yes Historical Provider, MD  lamoTRIgine (LAMICTAL) 200 MG tablet Take 2 tablets (400 mg total) by mouth 2 (two) times daily. Patient taking differently: Take 100 mg  by mouth 2 (two) times daily.  11/26/13  Yes Philmore Pali, NP  lisinopril (PRINIVIL,ZESTRIL) 10 MG tablet TAKE 1 TABLET BY MOUTH EVERY DAY 04/12/14  Yes Kathrynn Ducking, MD  lithium 300 MG tablet TAKE 1 TABLET (300 MG TOTAL) BY MOUTH 3 (THREE) TIMES DAILY WITH MEALS. 04/12/14  Yes Kathrynn Ducking, MD  meclizine (ANTIVERT) 25 MG tablet Take 1 tablet (25 mg total) by mouth 3 (three) times daily as needed for dizziness. 05/11/14  Yes Virgel Manifold, MD  metFORMIN (GLUCOPHAGE) 500 MG tablet Take 1,000 mg by mouth 2 (two) times daily with a meal.  11/23/13  Yes Historical Provider, MD   pramipexole (MIRAPEX) 0.75 MG tablet TAKE 1 TABLET BY MOUTH EVERY NIGHT AT BEDTIME. 07/29/14  Yes Kathrynn Ducking, MD  pravastatin (PRAVACHOL) 40 MG tablet Take 40 mg by mouth at bedtime.    Yes Historical Provider, MD  zolpidem (AMBIEN) 10 MG tablet Take 1 tablet (10 mg total) by mouth at bedtime as needed for sleep. 04/06/14  Yes Kathrynn Ducking, MD  LORazepam (ATIVAN) 1 MG tablet Take 1 tablet (1 mg total) by mouth 2 (two) times daily. Patient not taking: Reported on 08/22/2014 08/24/13   Freddi Che, MD  VIMPAT 200 MG TABS tablet TAKE 1 TABLET BY MOUTH TWICE A DAY Patient not taking: Reported on 08/22/2014 05/18/14   Kathrynn Ducking, MD   BP 128/57 mmHg  Pulse 87  Temp(Src) 98.6 F (37 C)  Resp 18  Ht 5\' 8"  (1.727 m)  Wt 260 lb (117.935 kg)  BMI 39.54 kg/m2  SpO2 97% Physical Exam  Constitutional: He is oriented to person, place, and time. He appears well-developed and well-nourished. No distress.  HENT:  Head: Normocephalic and atraumatic.  Mouth/Throat: Oropharynx is clear and moist.  No oral trauma.  Eyes: Conjunctivae and EOM are normal. Pupils are equal, round, and reactive to light.  Neck: Normal range of motion. Neck supple. No spinous process tenderness and no muscular tenderness present.  No meningeal signs.  Cardiovascular: Normal rate, regular rhythm and normal heart sounds.   Pulmonary/Chest: Effort normal and breath sounds normal.  Musculoskeletal: Normal range of motion. He exhibits no edema.  Neurological: He is alert and oriented to person, place, and time. He has normal strength. No cranial nerve deficit or sensory deficit. He displays a negative Romberg sign. He displays no seizure activity. Coordination and gait normal. GCS eye subscore is 4. GCS verbal subscore is 5. GCS motor subscore is 6.  Speech fluent and goal oriented. Moves extremities without ataxia.  Skin: Skin is warm and dry. No rash noted. He is not diaphoretic.  Psychiatric: He has a normal mood  and affect. His behavior is normal.  Nursing note and vitals reviewed.   ED Course  Procedures (including critical care time) Labs Review Labs Reviewed  BASIC METABOLIC PANEL - Abnormal; Notable for the following:    Glucose, Bld 208 (*)    Calcium 8.7 (*)    All other components within normal limits  PHENYTOIN LEVEL, TOTAL - Abnormal; Notable for the following:    Phenytoin Lvl 9.6 (*)    All other components within normal limits  CBG MONITORING, ED - Abnormal; Notable for the following:    Glucose-Capillary 191 (*)    All other components within normal limits  CBC    Imaging Review No results found.   EKG Interpretation None      MDM   Final diagnoses:  Seizure  Non-toxic appearing, NAD. AFVSS. No seizure activity on arrival, no longer post-ictal. No focal neuro deficits. Blood glucose 208. Labs otherwise without acute finding. Phenytoin level 9.6, cutoff 10, states he takes this medication at [facility] and again at [facility]. He will take his afternoon dose when he leaves the ER. No fever or leukocytosis. Advised the pt to not drink sugary energy drinks or sugary drinks in general. Stable for d/c. Advised f/u with his neurologist within 1 week. Return precautions given. Patient states understanding of treatment care plan and is agreeable.  Carman Ching, PA-C 08/22/14 1531  Debby Freiberg, MD 08/23/14 [phone]

## 2014-08-22 NOTE — ED Notes (Addendum)
Per EMS- pt was at his elderly neighbors house when he had a tonic clonic witnessed seizure. Pt w hx of seizures. Post ictal on scene. Post ictal lasted longer than usual per neighbor. 18PIV placed to [place]. A&OX4 upon arrival. Pt did bite his tongue during seizure and reports soreness to tongue but no active bleeding noted. Pt has not missed any of his Dilantin.

## 2014-08-22 NOTE — Discharge Instructions (Signed)
I suggest decreasing the amount of sugary drinks that you have daily. Follow up with your neurologist.  Epilepsy Epilepsy is a disorder in which a person has repeated seizures over time. A seizure is a release of abnormal electrical activity in the brain. Seizures can cause a change in attention, behavior, or the ability to remain awake and alert (altered mental status). Seizures often involve uncontrollable shaking (convulsions).  Most people with epilepsy lead normal lives. However, people with epilepsy are at an increased risk of falls, accidents, and injuries. Therefore, it is important to begin treatment right away. CAUSES  Epilepsy has many possible causes. Anything that disturbs the normal pattern of brain cell activity can lead to seizures. This may include:   Head injury.  Birth trauma.  High fever as a child.  Stroke.  Bleeding into or around the brain.  Certain drugs.  Prolonged low oxygen, such as what occurs after CPR efforts.  Abnormal brain development.  Certain illnesses, such as meningitis, encephalitis (brain infection), malaria, and other infections.  An imbalance of nerve signaling chemicals (neurotransmitters).  SIGNS AND SYMPTOMS  The symptoms of a seizure can vary greatly from one person to another. Right before a seizure, you may have a warning (aura) that a seizure is about to occur. An aura may include the following symptoms:  Fear or anxiety.  Nausea.  Feeling like the room is spinning (vertigo).  Vision changes, such as seeing flashing lights or spots. Common symptoms during a seizure include:  Abnormal sensations, such as an abnormal smell or a bitter taste in the mouth.   Sudden, general body stiffness.   Convulsions that involve rhythmic jerking of the face, arm, or leg on one or both sides.   Sudden change in consciousness.   Appearing to be awake but not responding.   Appearing to be asleep but cannot be awakened.   Grimacing,  chewing, lip smacking, drooling, tongue biting, or loss of bowel or bladder control. After a seizure, you may feel sleepy for a while. DIAGNOSIS  Your health care provider will ask about your symptoms and take a medical history. Descriptions from any witnesses to your seizures will be very helpful in the diagnosis. A physical exam, including a detailed neurological exam, is necessary. Various tests may be done, such as:   An electroencephalogram (EEG). This is a painless test of your brain waves. In this test, a diagram is created of your brain waves. These diagrams can be interpreted by a specialist.  An MRI of the brain.   A CT scan of the brain.   A spinal tap (lumbar puncture, LP).  Blood tests to check for signs of infection or abnormal blood chemistry. TREATMENT  There is no cure for epilepsy, but it is generally treatable. Once epilepsy is diagnosed, it is important to begin treatment as soon as possible. For most people with epilepsy, seizures can be controlled with medicines. The following may also be used:  A pacemaker for the brain (vagus nerve stimulator) can be used for people with seizures that are not well controlled by medicine.  Surgery on the brain. For some people, epilepsy eventually goes away. HOME CARE INSTRUCTIONS   Follow your health care provider's recommendations on driving and safety in normal activities.  Get enough rest. Lack of sleep can cause seizures.  Only take over-the-counter or prescription medicines as directed by your health care provider. Take any prescribed medicine exactly as directed.  Avoid any known triggers of your seizures.  Keep a seizure diary. Record what you recall about any seizure, especially any possible trigger.   Make sure the people you live and work with know that you are prone to seizures. They should receive instructions on how to help you. In general, a witness to a seizure should:   Cushion your head and body.    Turn you on your side.   Avoid unnecessarily restraining you.   Not place anything inside your mouth.   Call for emergency medical help if there is any question about what has occurred.   Follow up with your health care provider as directed. You may need regular blood tests to monitor the levels of your medicine.  SEEK MEDICAL CARE IF:   You develop signs of infection or other illness. This might increase the risk of a seizure.   You seem to be having more frequent seizures.   Your seizure pattern is changing.  SEEK IMMEDIATE MEDICAL CARE IF:   You have a seizure that does not stop after a few moments.   You have a seizure that causes any difficulty in breathing.   You have a seizure that results in a very severe headache.   You have a seizure that leaves you with the inability to speak or use a part of your body.  Document Released: 01/29/2005 Document Revised: 11/19/2012 Document Reviewed: 09/10/2012 [facility] Patient Information 2015 [place], [place]. This information is not intended to replace advice given to you by your health care provider. Make sure you discuss any questions you have with your health care provider.  Seizure, Adult A seizure is abnormal electrical activity in the brain. Seizures usually last from 30 seconds to 2 minutes. There are various types of seizures. Before a seizure, you may have a warning sensation (aura) that a seizure is about to occur. An aura may include the following symptoms:   Fear or anxiety.  Nausea.  Feeling like the room is spinning (vertigo).  Vision changes, such as seeing flashing lights or spots. Common symptoms during a seizure include:  A change in attention or behavior (altered mental status).  Convulsions with rhythmic jerking movements.  Drooling.  Rapid eye movements.  Grunting.  Loss of bladder and bowel control.  Bitter taste in the mouth.  Tongue biting. After a seizure, you may feel  confused and sleepy. You may also have an injury resulting from convulsions during the seizure. HOME CARE INSTRUCTIONS   If you are given medicines, take them exactly as prescribed by your health care provider.  Keep all follow-up appointments as directed by your health care provider.  Do not swim or drive or engage in risky activity during which a seizure could cause further injury to you or others until your health care provider says it is OK.  Get adequate rest.  Teach friends and family what to do if you have a seizure. They should:  Lay you on the ground to prevent a fall.  Put a cushion under your head.  Loosen any tight clothing around your neck.  Turn you on your side. If vomiting occurs, this helps keep your airway clear.  Stay with you until you recover.  Know whether or not you need emergency care. SEEK IMMEDIATE MEDICAL CARE IF:  The seizure lasts longer than 5 minutes.  The seizure is severe or you do not wake up immediately after the seizure.  You have an altered mental status after the seizure.  You are having more frequent or worsening seizures.  Someone should drive you to the emergency department or call local emergency services (911 in [place] MAKE SURE YOU:  Understand these instructions.  Will watch your condition.  Will get help right away if you are not doing well or get worse. Document Released: 01/27/2000 Document Revised: 11/19/2012 Document Reviewed: 09/10/2012 [facility] Patient Information 2015 [place], [place]. This information is not intended to replace advice given to you by your health care provider. Make sure you discuss any questions you have with your health care provider.

## 2014-08-23 NOTE — Telephone Encounter (Signed)
I called the patient and left a voicemail asking him to call and schedule a follow up appointment within the next 1-2 weeks.

## 2014-08-24 NOTE — Telephone Encounter (Signed)
Appointment scheduled 7/25 at 12 PM.

## 2014-09-02 ENCOUNTER — Telehealth: Payer: Self-pay | Admitting: Neurology

## 2014-09-02 NOTE — Telephone Encounter (Signed)
Patient's mother is calling. She states the patient loses control real bad and is having problems controlling his anger. He smashed his $400 cell phone for no reason. His mother is very concerned and needs to discuss. The patient does have an appointment 09-06-14 but his mother says he will not discuss his problems. Please call the patient's mother tomorrow if possible before 10am. The patient's mother works and that is the only time she will be able to talk. Thank you.

## 2014-09-03 NOTE — Telephone Encounter (Signed)
The patient does have a history of underlying psychiatric disease, he clearly needs psychiatric follow-up. These issues cannot be adequately handled through this office. I will discuss this when I see the patient next week.

## 2014-09-03 NOTE — Telephone Encounter (Signed)
I called the patient's mom. She is scared of the patient. He has been "losing control" lately. He screams at her. He destroyed his cell phone and a few other things. She states this has been really bad for about two weeks. He has had two bad episodes of anger. He has also had a seizure within the past 2 weeks. She does not want the patient to know she called. I asked if the patient sees a psychiatrist. He has a history of borderline bipolar, depression, and self-inflicting harm. She states that he does not see one and she does not want Dr. Jannifer Franklin to refer him to one because she does not want him to know she called. She does not want Dr. Jannifer Franklin to call her after 10, as she will be at work. The patient has an appointment Monday afternoon. She said Dr. Jannifer Franklin could call her Monday morning. I advised her in the meantime to call 911 if he threatens her, himself or anyone else. She stated it would do no good because he can just charm the police. She just wanted "someone with MD behind their name to know how out of control he is." I told her I would let Dr. Jannifer Franklin know but I am not sure how much we can do to help if we cannot let him know that she called and is concerned.

## 2014-09-06 ENCOUNTER — Ambulatory Visit (INDEPENDENT_AMBULATORY_CARE_PROVIDER_SITE_OTHER): Payer: Medicare Other | Admitting: Neurology

## 2014-09-06 ENCOUNTER — Telehealth: Payer: Self-pay | Admitting: Neurology

## 2014-09-06 ENCOUNTER — Encounter: Payer: Self-pay | Admitting: Neurology

## 2014-09-06 VITALS — BP 126/66 | HR 80 | Ht 68.0 in | Wt 258.0 lb

## 2014-09-06 DIAGNOSIS — G40319 Generalized idiopathic epilepsy and epileptic syndromes, intractable, without status epilepticus: Secondary | ICD-10-CM

## 2014-09-06 DIAGNOSIS — G40311 Generalized idiopathic epilepsy and epileptic syndromes, intractable, with status epilepticus: Secondary | ICD-10-CM

## 2014-09-06 MED ORDER — PHENYTOIN 50 MG PO CHEW: 50.0000 mg | CHEWABLE_TABLET | Freq: Every day | ORAL | Status: DC

## 2014-09-06 NOTE — Telephone Encounter (Signed)
I called the patient's mother. She said the patient had 2 episodes of "jerking" over the weekend and he slept almost all weekend long. He did not get out of the bed all day Saturday and most of the day Sunday. She is concerned he will not tell Dr. Jannifer Franklin about this and wants him to know prior to the patient's visit.

## 2014-09-06 NOTE — Patient Instructions (Signed)
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
## 2014-09-06 NOTE — Telephone Encounter (Signed)
I saw the patient today in the office. He does admit to having some problems with anger spells, this has always been the case. He will try to let me know who he has seen in the past for psychological follow-up, I will make another referral.

## 2014-09-06 NOTE — Telephone Encounter (Addendum)
Patient mother called stating he had a seizure, 2 major melt downs and jerking(the jerking over the weekend) in the past 2 weeks. She states she is scared for him as this started after the seizure. Patient has an appointment today but she is wanting to touch base with the RN as she states he will not tell everything and will not tell about the meltdown. Please call and advise. She can be reached at [phone]. She is asking that Dr Jannifer Franklin to know what she spoke to [place] about on 09/03/14 but she doesn't want him to know she has called as she is afraid he will get mad at her. He told her wasn't going to tell Dr Jannifer Franklin about the meltdowns and anger.

## 2014-09-07 ENCOUNTER — Telehealth: Payer: Self-pay | Admitting: Neurology

## 2014-09-07 NOTE — Telephone Encounter (Signed)
I called patient. The patient is on lithium taking 300 mg 3 times daily. He indicates that when he was on this medication previously, it did make him more irritable. I will have him taper off of the lithium slowly, going to 1 tablet twice daily for 2 weeks, then take 1 tablet daily for 2 weeks, then stop.

## 2014-09-20 ENCOUNTER — Telehealth: Payer: Self-pay | Admitting: Neurology

## 2014-09-20 NOTE — Telephone Encounter (Signed)
Pharmacist Samir/ [facility] calling Lamictal and Dilantin, pt requested generic, is this ok

## 2014-09-20 NOTE — Telephone Encounter (Signed)
I called the patient. The patient does not wish to switch to generic. We will keep him on brand-name Lamictal and Dilantin.

## 2014-09-20 NOTE — Telephone Encounter (Signed)
Patient has been getting Brand Name Lamictal and Dilantin.  Pharmacy states the patient would like to change to generic.  Please advise.  Thank you.

## 2014-09-20 NOTE — Telephone Encounter (Signed)
I called the pharmacy back and spoke with [facility].  Explained the patient indicates he did not request generic, and per provider, he should remain on Brand Name at this time.  He indicated the patient specifically asked for generic, however, expressed understanding and says they will note file and proceed with Brand Only as prescribed.

## 2014-09-21 ENCOUNTER — Other Ambulatory Visit: Payer: Self-pay | Admitting: Neurology

## 2014-09-22 NOTE — Telephone Encounter (Signed)
Prescribed at this dose at OV on 10/15

## 2014-09-23 ENCOUNTER — Other Ambulatory Visit: Payer: Self-pay | Admitting: Neurology

## 2014-09-23 NOTE — Telephone Encounter (Signed)
Kathrynn Ducking, MD at 09/07/2014 6:33 PM     Status: Signed       Expand All Collapse All   I called patient. The patient is on lithium taking 300 mg 3 times daily. He indicates that when he was on this medication previously, it did make him more irritable. I will have him taper off of the lithium slowly, going to 1 tablet twice daily for 2 weeks, then take 1 tablet daily for 2 weeks, then stop.

## 2014-09-27 NOTE — Telephone Encounter (Signed)
error 

## 2014-10-04 ENCOUNTER — Other Ambulatory Visit: Payer: Self-pay | Admitting: Neurology

## 2014-10-04 NOTE — Telephone Encounter (Signed)
Rx signed and faxed.

## 2014-10-20 ENCOUNTER — Telehealth: Payer: Self-pay

## 2014-10-20 ENCOUNTER — Emergency Department (HOSPITAL_COMMUNITY)
Admission: EM | Admit: 2014-10-20 | Discharge: 2014-10-20 | Disposition: A | Discharge status: Home / Self Care | Payer: Medicare Other | Attending: Emergency Medicine | Admitting: Emergency Medicine

## 2014-10-20 ENCOUNTER — Encounter (HOSPITAL_COMMUNITY): Payer: Self-pay | Admitting: Emergency Medicine

## 2014-10-20 DIAGNOSIS — X58XXXA Exposure to other specified factors, initial encounter: Secondary | ICD-10-CM | POA: Diagnosis not present

## 2014-10-20 DIAGNOSIS — S0031XA Abrasion of nose, initial encounter: Secondary | ICD-10-CM | POA: Diagnosis not present

## 2014-10-20 DIAGNOSIS — Y998 Other external cause status: Secondary | ICD-10-CM | POA: Insufficient documentation

## 2014-10-20 DIAGNOSIS — I10 Essential (primary) hypertension: Secondary | ICD-10-CM | POA: Insufficient documentation

## 2014-10-20 DIAGNOSIS — Y92009 Unspecified place in unspecified non-institutional (private) residence as the place of occurrence of the external cause: Secondary | ICD-10-CM | POA: Insufficient documentation

## 2014-10-20 DIAGNOSIS — F329 Major depressive disorder, single episode, unspecified: Secondary | ICD-10-CM | POA: Diagnosis not present

## 2014-10-20 DIAGNOSIS — E669 Obesity, unspecified: Secondary | ICD-10-CM | POA: Diagnosis not present

## 2014-10-20 DIAGNOSIS — G40909 Epilepsy, unspecified, not intractable, without status epilepticus: Secondary | ICD-10-CM

## 2014-10-20 DIAGNOSIS — Y9389 Activity, other specified: Secondary | ICD-10-CM | POA: Diagnosis not present

## 2014-10-20 DIAGNOSIS — F419 Anxiety disorder, unspecified: Secondary | ICD-10-CM | POA: Insufficient documentation

## 2014-10-20 DIAGNOSIS — Z87891 Personal history of nicotine dependence: Secondary | ICD-10-CM | POA: Insufficient documentation

## 2014-10-20 DIAGNOSIS — E119 Type 2 diabetes mellitus without complications: Secondary | ICD-10-CM | POA: Insufficient documentation

## 2014-10-20 DIAGNOSIS — Z79899 Other long term (current) drug therapy: Secondary | ICD-10-CM | POA: Insufficient documentation

## 2014-10-20 DIAGNOSIS — R569 Unspecified convulsions: Secondary | ICD-10-CM | POA: Diagnosis not present

## 2014-10-20 LAB — BASIC METABOLIC PANEL
ANION GAP: 14 (ref 5–15)
BUN: 14 mg/dL (ref 6–20)
CHLORIDE: 102 mmol/L (ref 101–111)
CO2: 20 mmol/L — ABNORMAL LOW (ref 22–32)
Calcium: 8.9 mg/dL (ref 8.9–10.3)
Creatinine, Ser: 0.78 mg/dL (ref 0.61–1.24)
GFR calc non Af Amer: >60 mL/min (ref >60)
Glucose, Bld: 183 mg/dL — ABNORMAL HIGH (ref 65–99)
Potassium: 3.9 mmol/L (ref 3.5–5.1)
Sodium: 136 mmol/L (ref 135–145)

## 2014-10-20 LAB — CBG MONITORING, ED: Glucose-Capillary: 181 mg/dL — ABNORMAL HIGH (ref 65–99)

## 2014-10-20 LAB — PHENYTOIN LEVEL, TOTAL: Phenytoin Lvl: 13 ug/mL (ref 10.0–20.0)

## 2014-10-20 MED ORDER — LACOSAMIDE 100 MG PO TABS: ORAL_TABLET | ORAL | Status: DC

## 2014-10-20 NOTE — Telephone Encounter (Signed)
Patient's mother came into the office today. She is very concerned that the patient is continuing to have seizures, and she knows that he has been taking his medication because she has watched him on camera. She stated that he had 2 seizures today, but is okay right now. She feels that the patient needs to go to the ED, but that he would not go unless Dr. Jannifer Franklin told him it was medically necessary. I advised the patient's mother that Dr. Jannifer Franklin was aware at his last office visit that his seizures were not well controlled and he increased the patient's Dilantin. He also advised the patient to call if he continued to have seizures. I assured the patient's mother that Dr. Jannifer Franklin would give her a call back.

## 2014-10-20 NOTE — ED Notes (Signed)
Bed: WA06 Expected date:  Expected time:  Means of arrival:  Comments: seizure 

## 2014-10-20 NOTE — ED Notes (Signed)
Pt presents from home via EMS for seizure. Mother reports pt had 3 seizures today, grand mal. No incontinence.    Last VS: 130/88, cbg 161, 90hr.

## 2014-10-20 NOTE — Telephone Encounter (Signed)
I called the mother. The patient apparently just had another seizure event, this may have been the third seizure today. EMS has been called, the patient may require emergency room evaluation, is not clear whether he has missed any doses of medications or not. I got cut off, tried to call back, unable to reach the mother.  I called again, talk with the mother. The patient had a seizure 2 days ago, had to minor seizures earlier today, and then a more significant grand mal seizure just now. The patient may be going to the hospital, we may need to get him in for a revisit in the next several days. Not clear why his seizures are under poor control right now.

## 2014-10-20 NOTE — Discharge Instructions (Signed)
Epilepsy °People with epilepsy have times when they shake and jerk uncontrollably (seizures). This happens when there is a sudden change in brain function. Epilepsy may have many possible causes. Anything that disturbs the normal pattern of brain cell activity can lead to seizures. °HOME CARE  °· Follow your doctor's instructions about driving and safety during normal activities. °· Get enough sleep. °· Only take medicine as told by your doctor. °· Avoid things that you know can cause you to have seizures (triggers). °· Write down when your seizures happen and what you remember about each seizure. Write down anything you think may have caused the seizure to happen. °· Tell the people you live and work with that you have seizures. Make sure they know how to help you. They should: °¨ Cushion your head and body. °¨ Turn you on your side. °¨ Not restrain you. °¨ Not place anything inside your mouth. °¨ Call for local emergency medical help if there is any question about what has happened. °· Keep all follow-up visits with your doctor. This is very important. °GET HELP IF: °· You get an infection or start to feel sick. You may have more seizures when you are sick. °· You are having seizures more often. °· Your seizure pattern is changing. °GET HELP RIGHT AWAY IF:  °· A seizure does not stop after a few seconds or minutes. °· A seizure causes you to have trouble breathing. °· A seizure gives you a very bad headache. °· A seizure makes you unable to speak or use a part of your body. °Document Released: 11/26/2008 Document Revised: 11/19/2012 Document Reviewed: 09/10/2012 °[employer]® Patient Information ©2015 [employer]. This information is not intended to replace advice given to you by your health care provider. Make sure you discuss any questions you have with your health care provider. ° °
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
## 2014-10-20 NOTE — Telephone Encounter (Signed)
Pt's mother called and is very distraught. She is scared to leave him by himself. States that he has had headache today and has also had 2 seizures. She is wanting a call from Dr. Jannifer Franklin as soon as possible. She says that he is out of it right now and he does not even remember what was going on. She thinks he has a brain tumor. She was told years ago that he had a small tumor and is afraid that it has grown. Mother states that anything that Dr. Jannifer Franklin tells him to do he will do it.  Please call and advise [phone]. Mother is requesting to be contacted not her son( pt)

## 2014-10-20 NOTE — ED Notes (Signed)
Pt is A&O x4, speaking in complete sentences, has unhooked himself from cardiac monitor.

## 2014-10-21 NOTE — Telephone Encounter (Signed)
Pt called this morning to get appt with Dr. Jannifer Franklin. Pt went to ER last night due to [facility] seizure. He had a total of 3 seizures yesterday. Nurse was notified of importance for appt and will call pt to schedule.

## 2014-10-21 NOTE — Telephone Encounter (Signed)
I called the patient. Appointment scheduled 9/13 at 8 AM.

## 2014-10-23 ENCOUNTER — Other Ambulatory Visit: Payer: Self-pay | Admitting: Neurology

## 2014-10-26 ENCOUNTER — Telehealth: Payer: Self-pay | Admitting: Neurology

## 2014-10-26 ENCOUNTER — Ambulatory Visit (INDEPENDENT_AMBULATORY_CARE_PROVIDER_SITE_OTHER): Payer: Medicare Other | Admitting: Neurology

## 2014-10-26 ENCOUNTER — Encounter: Payer: Self-pay | Admitting: Neurology

## 2014-10-26 VITALS — BP 135/82 | HR 78 | Ht 68.0 in | Wt 253.5 lb

## 2014-10-26 DIAGNOSIS — G40319 Generalized idiopathic epilepsy and epileptic syndromes, intractable, without status epilepticus: Secondary | ICD-10-CM

## 2014-10-26 DIAGNOSIS — G2581 Restless legs syndrome: Secondary | ICD-10-CM

## 2014-10-26 DIAGNOSIS — G40311 Generalized idiopathic epilepsy and epileptic syndromes, intractable, with status epilepticus: Secondary | ICD-10-CM

## 2014-10-26 MED ORDER — LACOSAMIDE 100 MG PO TABS: ORAL_TABLET | ORAL | Status: DC

## 2014-10-26 MED ORDER — LAMOTRIGINE 100 MG PO TABS: 200.0000 mg | ORAL_TABLET | Freq: Two times a day (BID) | ORAL | Status: DC

## 2014-10-26 NOTE — Telephone Encounter (Signed)
Patient is calling and states that he is going over his medication list and needs to know if he is should still taking Ativan 1mg .  If he is he needs a prescription.

## 2014-10-26 NOTE — Telephone Encounter (Signed)
By viewing the medication history, it appears this drug was only prescribed once, in July of last year for 10 tablets at ED.  I called the patient back.  Relayed this info.  He expressed undrestanding and says nothing further is needed at this time.

## 2014-10-26 NOTE — Patient Instructions (Addendum)
We will go up on the Lamictal slowly, the medication for several months. If we go up too quickly, a skin rash may occur. We will start with the 100 mg tablets, take one half tablet daily for 2 weeks, then take one half tablet twice daily for 2 weeks, then take one half tablet in the morning and 1 full tablet in the evening for 2 weeks, then take 1 full tablet twice daily.   Epilepsy Epilepsy is a disorder in which a person has repeated seizures over time. A seizure is a release of abnormal electrical activity in the brain. Seizures can cause a change in attention, behavior, or the ability to remain awake and alert (altered mental status). Seizures often involve uncontrollable shaking (convulsions).  Most people with epilepsy lead normal lives. However, people with epilepsy are at an increased risk of falls, accidents, and injuries. Therefore, it is important to begin treatment right away. CAUSES  Epilepsy has many possible causes. Anything that disturbs the normal pattern of brain cell activity can lead to seizures. This may include:   Head injury.  Birth trauma.  High fever as a child.  Stroke.  Bleeding into or around the brain.  Certain drugs.  Prolonged low oxygen, such as what occurs after CPR efforts.  Abnormal brain development.  Certain illnesses, such as meningitis, encephalitis (brain infection), malaria, and other infections.  An imbalance of nerve signaling chemicals (neurotransmitters).  SIGNS AND SYMPTOMS  The symptoms of a seizure can vary greatly from one person to another. Right before a seizure, you may have a warning (aura) that a seizure is about to occur. An aura may include the following symptoms:  Fear or anxiety.  Nausea.  Feeling like the room is spinning (vertigo).  Vision changes, such as seeing flashing lights or spots. Common symptoms during a seizure include:  Abnormal sensations, such as an abnormal smell or a bitter taste in the mouth.    Sudden, general body stiffness.   Convulsions that involve rhythmic jerking of the face, arm, or leg on one or both sides.   Sudden change in consciousness.   Appearing to be awake but not responding.   Appearing to be asleep but cannot be awakened.   Grimacing, chewing, lip smacking, drooling, tongue biting, or loss of bowel or bladder control. After a seizure, you may feel sleepy for a while. DIAGNOSIS  Your health care provider will ask about your symptoms and take a medical history. Descriptions from any witnesses to your seizures will be very helpful in the diagnosis. A physical exam, including a detailed neurological exam, is necessary. Various tests may be done, such as:   An electroencephalogram (EEG). This is a painless test of your brain waves. In this test, a diagram is created of your brain waves. These diagrams can be interpreted by a specialist.  An MRI of the brain.   A CT scan of the brain.   A spinal tap (lumbar puncture, LP).  Blood tests to check for signs of infection or abnormal blood chemistry. TREATMENT  There is no cure for epilepsy, but it is generally treatable. Once epilepsy is diagnosed, it is important to begin treatment as soon as possible. For most people with epilepsy, seizures can be controlled with medicines. The following may also be used:  A pacemaker for the brain (vagus nerve stimulator) can be used for people with seizures that are not well controlled by medicine.  Surgery on the brain. For some people, epilepsy eventually  goes away. HOME CARE INSTRUCTIONS   Follow your health care provider's recommendations on driving and safety in normal activities.  Get enough rest. Lack of sleep can cause seizures.  Only take over-the-counter or prescription medicines as directed by your health care provider. Take any prescribed medicine exactly as directed.  Avoid any known triggers of your seizures.  Keep a seizure diary. Record what  you recall about any seizure, especially any possible trigger.   Make sure the people you live and work with know that you are prone to seizures. They should receive instructions on how to help you. In general, a witness to a seizure should:   Cushion your head and body.   Turn you on your side.   Avoid unnecessarily restraining you.   Not place anything inside your mouth.   Call for emergency medical help if there is any question about what has occurred.   Follow up with your health care provider as directed. You may need regular blood tests to monitor the levels of your medicine.  SEEK MEDICAL CARE IF:   You develop signs of infection or other illness. This might increase the risk of a seizure.   You seem to be having more frequent seizures.   Your seizure pattern is changing.  SEEK IMMEDIATE MEDICAL CARE IF:   You have a seizure that does not stop after a few moments.   You have a seizure that causes any difficulty in breathing.   You have a seizure that results in a very severe headache.   You have a seizure that leaves you with the inability to speak or use a part of your body.  Document Released: 01/29/2005 Document Revised: 11/19/2012 Document Reviewed: 09/10/2012 [facility] Patient Information 2015 [place], [place]. This information is not intended to replace advice given to you by your health care provider. Make sure you discuss any questions you have with your health care provider.
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
## 2014-11-02 ENCOUNTER — Other Ambulatory Visit: Payer: Self-pay | Admitting: Neurology

## 2014-11-02 MED ORDER — LACOSAMIDE 150 MG PO TABS: 300.0000 mg | ORAL_TABLET | Freq: Every day | ORAL | Status: DC

## 2014-11-02 MED ORDER — LACOSAMIDE 200 MG PO TABS: 200.0000 mg | ORAL_TABLET | ORAL | Status: DC

## 2014-11-22 ENCOUNTER — Ambulatory Visit: Payer: Medicare Other | Admitting: Neurology

## 2014-12-08 ENCOUNTER — Other Ambulatory Visit: Payer: Self-pay | Admitting: Neurology

## 2014-12-19 ENCOUNTER — Other Ambulatory Visit: Payer: Self-pay | Admitting: Neurology

## 2014-12-28 DIAGNOSIS — I10 Essential (primary) hypertension: Secondary | ICD-10-CM | POA: Diagnosis not present

## 2014-12-28 DIAGNOSIS — Z6841 Body Mass Index (BMI) 40.0 and over, adult: Secondary | ICD-10-CM | POA: Diagnosis not present

## 2014-12-28 DIAGNOSIS — Z23 Encounter for immunization: Secondary | ICD-10-CM | POA: Diagnosis not present

## 2014-12-28 DIAGNOSIS — Z0000 Encounter for general adult medical examination without abnormal findings: Secondary | ICD-10-CM | POA: Diagnosis not present

## 2014-12-28 DIAGNOSIS — Z1389 Encounter for screening for other disorder: Secondary | ICD-10-CM | POA: Diagnosis not present

## 2014-12-28 DIAGNOSIS — E119 Type 2 diabetes mellitus without complications: Secondary | ICD-10-CM | POA: Diagnosis not present

## 2014-12-28 DIAGNOSIS — G40909 Epilepsy, unspecified, not intractable, without status epilepticus: Secondary | ICD-10-CM | POA: Diagnosis not present

## 2014-12-28 DIAGNOSIS — E7800 Pure hypercholesterolemia, unspecified: Secondary | ICD-10-CM | POA: Diagnosis not present

## 2015-01-02 ENCOUNTER — Encounter (HOSPITAL_COMMUNITY): Payer: Self-pay | Admitting: Emergency Medicine

## 2015-01-02 ENCOUNTER — Emergency Department (HOSPITAL_COMMUNITY)
Admission: EM | Admit: 2015-01-02 | Discharge: 2015-01-02 | Discharge status: Against Medical Advice | Payer: Medicare Other | Attending: Emergency Medicine | Admitting: Emergency Medicine

## 2015-01-02 DIAGNOSIS — E669 Obesity, unspecified: Secondary | ICD-10-CM | POA: Diagnosis not present

## 2015-01-02 DIAGNOSIS — E119 Type 2 diabetes mellitus without complications: Secondary | ICD-10-CM | POA: Insufficient documentation

## 2015-01-02 DIAGNOSIS — I10 Essential (primary) hypertension: Secondary | ICD-10-CM | POA: Diagnosis not present

## 2015-01-02 DIAGNOSIS — R569 Unspecified convulsions: Secondary | ICD-10-CM | POA: Insufficient documentation

## 2015-01-02 NOTE — ED Notes (Signed)
Pt states he will be leaving AMA and his friend is on the way to get him

## 2015-01-02 NOTE — ED Notes (Signed)
Bed: WA25 Expected date:  Expected time:  Means of arrival:  Comments: seizure 

## 2015-01-02 NOTE — ED Notes (Signed)
Patient refused to be treated.  He states he is leaving AMA.  Doesn't wished to be seen by doctor or have medical treatment.

## 2015-01-02 NOTE — ED Notes (Signed)
Per patient, he was brought in by EMS.  Brought in for seizures.  Patient denies hitting head, LOC, and n/v/d.  He is ambulatory and A & O.  He states he is leaving AMA.

## 2015-01-02 NOTE — ED Notes (Signed)
Pt refused uring specimen and ekg/ heart monitor

## 2015-01-02 NOTE — ED Notes (Signed)
Pt arrived via EMs with report of having seizure-like activity that was witnessed via friend lasting 2-54min. Upon EMS arrival reported that pt was conscious and postical and pt got on stretcher without assistance. Pt arrived to ED refused to give urine sample and stated that he did not want to receive care and plans to leave when friend arrives.

## 2015-01-08 ENCOUNTER — Other Ambulatory Visit: Payer: Self-pay | Admitting: Neurology

## 2015-01-19 DIAGNOSIS — H2513 Age-related nuclear cataract, bilateral: Secondary | ICD-10-CM | POA: Diagnosis not present

## 2015-01-19 DIAGNOSIS — D3132 Benign neoplasm of left choroid: Secondary | ICD-10-CM | POA: Diagnosis not present

## 2015-01-19 DIAGNOSIS — E119 Type 2 diabetes mellitus without complications: Secondary | ICD-10-CM | POA: Diagnosis not present

## 2015-01-31 ENCOUNTER — Ambulatory Visit: Payer: Medicare Other | Admitting: Neurology

## 2015-02-16 ENCOUNTER — Telehealth: Payer: Self-pay | Admitting: *Deleted

## 2015-02-16 NOTE — Telephone Encounter (Signed)
Fax received from patient stating "be on the look out for requests from [facility]".  We will be certain to watch for this fax and respond accordingly.

## 2015-02-17 ENCOUNTER — Telehealth: Payer: Self-pay | Admitting: Neurology

## 2015-02-17 ENCOUNTER — Other Ambulatory Visit: Payer: Self-pay

## 2015-02-17 MED ORDER — PRAMIPEXOLE DIHYDROCHLORIDE 0.75 MG PO TABS: 0.7500 mg | ORAL_TABLET | Freq: Every day | ORAL | Status: DC

## 2015-02-17 MED ORDER — LACOSAMIDE 150 MG PO TABS: 300.0000 mg | ORAL_TABLET | Freq: Every day | ORAL | Status: DC

## 2015-02-17 MED ORDER — MECLIZINE HCL 25 MG PO TABS: ORAL_TABLET | ORAL | Status: DC

## 2015-02-17 MED ORDER — LACOSAMIDE 200 MG PO TABS: 200.0000 mg | ORAL_TABLET | ORAL | Status: DC

## 2015-02-17 MED ORDER — LISINOPRIL 10 MG PO TABS: 10.0000 mg | ORAL_TABLET | Freq: Every day | ORAL | Status: DC

## 2015-02-17 MED ORDER — DILANTIN 100 MG PO CAPS: ORAL_CAPSULE | ORAL | Status: DC

## 2015-02-17 MED ORDER — ESCITALOPRAM OXALATE 20 MG PO TABS: 20.0000 mg | ORAL_TABLET | Freq: Every day | ORAL | Status: DC

## 2015-02-17 MED ORDER — ZOLPIDEM TARTRATE 10 MG PO TABS: 10.0000 mg | ORAL_TABLET | Freq: Every evening | ORAL | Status: DC | PRN

## 2015-02-17 MED ORDER — PHENYTOIN 50 MG PO CHEW: 50.0000 mg | CHEWABLE_TABLET | Freq: Every day | ORAL | Status: DC

## 2015-02-17 MED ORDER — CHOLECALCIFEROL 25 MCG (1000 UT) PO CAPS: ORAL_CAPSULE | ORAL | Status: DC

## 2015-02-17 MED ORDER — LAMOTRIGINE 100 MG PO TABS: ORAL_TABLET | ORAL | Status: DC

## 2015-02-17 NOTE — Telephone Encounter (Signed)
Pt called to make sure all meds need to go thru [facility] mail order service as of 02/13/15. Please call and advise

## 2015-02-17 NOTE — Telephone Encounter (Signed)
It appears all Rx's have already been sent to [facility].  I called back and spoke with patient.  He expressed understanding and appreciation.

## 2015-02-22 ENCOUNTER — Telehealth: Payer: Self-pay | Admitting: Neurology

## 2015-02-22 NOTE — Telephone Encounter (Signed)
I called the patient. The patient has gotten the generic prescription for the Lamictal. Certainly, brand-name of the medication would be preferred, but if the patient is unable to afford the medication, we'll need to use the generic. He will contact me if he believes that this has made a change in his seizure control.

## 2015-02-22 NOTE — Telephone Encounter (Signed)
Spoke to [place] with [facility] who stated that letter of clarification was sent over regarding the VIMPAT dosage amount on 02/17/15.  There were 2 rx's sent on the same day.  The first was 150mg  is 180 tabs for 90 days then the 2nd is 200mg  tab is 90 tabs for 90 days.  She can be reached on [phone] ext [phone].

## 2015-02-22 NOTE — Telephone Encounter (Signed)
I called back.  There was no answer at extension provided.  Left message clarifying info.  Asked that they call back if anything further is needed.

## 2015-02-22 NOTE — Telephone Encounter (Signed)
Pt called [facility] lamoTRIgine (LAMICTAL) 100 MG tablet will cost $97/mth for brand. He is inquiring if he can take generic.

## 2015-03-14 ENCOUNTER — Other Ambulatory Visit: Payer: Self-pay | Admitting: Neurology

## 2015-03-16 ENCOUNTER — Ambulatory Visit (INDEPENDENT_AMBULATORY_CARE_PROVIDER_SITE_OTHER): Payer: Commercial Managed Care - HMO | Admitting: Neurology

## 2015-03-16 ENCOUNTER — Encounter: Payer: Self-pay | Admitting: Neurology

## 2015-03-16 VITALS — BP 141/88 | HR 81 | Ht 68.0 in

## 2015-03-16 DIAGNOSIS — R569 Unspecified convulsions: Secondary | ICD-10-CM | POA: Diagnosis not present

## 2015-03-16 DIAGNOSIS — G40119 Localization-related (focal) (partial) symptomatic epilepsy and epileptic syndromes with simple partial seizures, intractable, without status epilepticus: Secondary | ICD-10-CM

## 2015-03-16 DIAGNOSIS — G40319 Generalized idiopathic epilepsy and epileptic syndromes, intractable, without status epilepticus: Secondary | ICD-10-CM | POA: Diagnosis not present

## 2015-03-16 DIAGNOSIS — G2581 Restless legs syndrome: Secondary | ICD-10-CM

## 2015-03-16 DIAGNOSIS — Z5181 Encounter for therapeutic drug level monitoring: Secondary | ICD-10-CM

## 2015-03-16 MED ORDER — LAMOTRIGINE 200 MG PO TABS: 200.0000 mg | ORAL_TABLET | Freq: Every day | ORAL | Status: DC
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
## 2015-03-16 NOTE — Procedures (Signed)
    History: Eric Fernandez is a 40 year old gentleman with intractable epilepsy and bipolar disorder. He has had increased "phase out" episodes recently. He is on Dilantin, Lamictal, and Vimpat. He is being reevaluated for the seizures.   VAGAL NERVE STIMULATOR SETTINGS: Output current: 150 milliamps  Signal frequency: 30 Hz  Pulse width: 250 microseconds changed to  500 microseconds  On time: 30 seconds  Off time: 1.8 minutes changed to 1.1 minutes  Magnet current: 2.0 milliamps  Magnet on time: 60 seconds  Pulse width: 250 microseconds  Activations: 109  Output current: 1.5 milliamps  Lead impedance: 2895 Ohms  IFI: No  The VNS unit was interrogated, and the settings were changed as above. The patient appeared to tolerate the changes well.  Jill Alexanders MD 03/16/2015 8:57 AM  [facility] [address] [place], [place] [address]  Phone [phone] Fax [phone]

## 2015-03-16 NOTE — Patient Instructions (Signed)

## 2015-03-17 LAB — COMPREHENSIVE METABOLIC PANEL
ALBUMIN: 4.8 g/dL (ref 3.5–5.5)
ALT: 29 IU/L (ref 0–44)
AST: 20 IU/L (ref 0–40)
Albumin/Globulin Ratio: 2.3 (ref 1.1–2.5)
Alkaline Phosphatase: 118 IU/L — ABNORMAL HIGH (ref 39–117)
BILIRUBIN TOTAL: 0.3 mg/dL (ref 0.0–1.2)
BUN / CREAT RATIO: 22 — AB (ref 8–19)
BUN: 15 mg/dL (ref 6–20)
CALCIUM: 9.4 mg/dL (ref 8.7–10.2)
CO2: 22 mmol/L (ref 18–29)
CREATININE: 0.69 mg/dL — AB (ref 0.76–1.27)
Chloride: 97 mmol/L (ref 96–106)
GFR, EST AFRICAN AMERICAN: 138 mL/min/{1.73_m2} (ref >59)
GFR, EST NON AFRICAN AMERICAN: 120 mL/min/{1.73_m2} (ref >59)
GLUCOSE: 186 mg/dL — AB (ref 65–99)
Globulin, Total: 2.1 g/dL (ref 1.5–4.5)
Potassium: 4.4 mmol/L (ref 3.5–5.2)
Sodium: 138 mmol/L (ref 134–144)
TOTAL PROTEIN: 6.9 g/dL (ref 6.0–8.5)

## 2015-03-17 LAB — CBC WITH DIFFERENTIAL/PLATELET
BASOS ABS: 0 10*3/uL (ref 0.0–0.2)
BASOS: 1 %
EOS (ABSOLUTE): 0.1 10*3/uL (ref 0.0–0.4)
EOS: 1 %
HEMATOCRIT: 42.9 % (ref 37.5–51.0)
HEMOGLOBIN: 14.8 g/dL (ref 12.6–17.7)
IMMATURE GRANS (ABS): 0 10*3/uL (ref 0.0–0.1)
Immature Granulocytes: 0 %
LYMPHS ABS: 1.6 10*3/uL (ref 0.7–3.1)
LYMPHS: 25 %
MCH: 30.7 pg (ref 26.6–33.0)
MCHC: 34.5 g/dL (ref 31.5–35.7)
MCV: 89 fL (ref 79–97)
MONOCYTES: 8 %
Monocytes Absolute: 0.5 10*3/uL (ref 0.1–0.9)
NEUTROS ABS: 4.1 10*3/uL (ref 1.4–7.0)
Neutrophils: 65 %
Platelets: 265 10*3/uL (ref 150–379)
RBC: 4.82 x10E6/uL (ref 4.14–5.80)
RDW: 12.5 % (ref 12.3–15.4)
WBC: 6.3 10*3/uL (ref 3.4–10.8)

## 2015-03-17 LAB — PHENYTOIN LEVEL, TOTAL: PHENYTOIN (DILANTIN), SERUM: 18.7 ug/mL (ref 10.0–20.0)

## 2015-03-17 LAB — LAMOTRIGINE LEVEL: Lamotrigine Lvl: 3.4 ug/mL (ref 2.0–20.0)

## 2015-04-05 ENCOUNTER — Other Ambulatory Visit: Payer: Self-pay

## 2015-04-05 DIAGNOSIS — Z7984 Long term (current) use of oral hypoglycemic drugs: Secondary | ICD-10-CM | POA: Diagnosis not present

## 2015-04-05 DIAGNOSIS — E1165 Type 2 diabetes mellitus with hyperglycemia: Secondary | ICD-10-CM | POA: Diagnosis not present

## 2015-04-05 MED ORDER — LAMOTRIGINE 200 MG PO TABS: 200.0000 mg | ORAL_TABLET | Freq: Two times a day (BID) | ORAL | Status: DC

## 2015-04-30 ENCOUNTER — Emergency Department (HOSPITAL_COMMUNITY)
Admission: EM | Admit: 2015-04-30 | Discharge: 2015-04-30 | Disposition: A | Discharge status: Home / Self Care | Payer: Commercial Managed Care - HMO | Attending: Emergency Medicine | Admitting: Emergency Medicine

## 2015-04-30 ENCOUNTER — Encounter (HOSPITAL_COMMUNITY): Payer: Self-pay | Admitting: Emergency Medicine

## 2015-04-30 DIAGNOSIS — R569 Unspecified convulsions: Secondary | ICD-10-CM | POA: Insufficient documentation

## 2015-04-30 DIAGNOSIS — Z87828 Personal history of other (healed) physical injury and trauma: Secondary | ICD-10-CM | POA: Diagnosis not present

## 2015-04-30 DIAGNOSIS — G40909 Epilepsy, unspecified, not intractable, without status epilepticus: Secondary | ICD-10-CM | POA: Diagnosis not present

## 2015-04-30 DIAGNOSIS — E119 Type 2 diabetes mellitus without complications: Secondary | ICD-10-CM | POA: Insufficient documentation

## 2015-04-30 DIAGNOSIS — E669 Obesity, unspecified: Secondary | ICD-10-CM | POA: Insufficient documentation

## 2015-04-30 DIAGNOSIS — G2581 Restless legs syndrome: Secondary | ICD-10-CM | POA: Insufficient documentation

## 2015-04-30 DIAGNOSIS — F329 Major depressive disorder, single episode, unspecified: Secondary | ICD-10-CM | POA: Diagnosis not present

## 2015-04-30 DIAGNOSIS — F419 Anxiety disorder, unspecified: Secondary | ICD-10-CM | POA: Diagnosis not present

## 2015-04-30 DIAGNOSIS — I10 Essential (primary) hypertension: Secondary | ICD-10-CM | POA: Diagnosis not present

## 2015-04-30 DIAGNOSIS — T1490 Injury, unspecified: Secondary | ICD-10-CM | POA: Diagnosis not present

## 2015-04-30 NOTE — Discharge Instructions (Signed)

## 2015-04-30 NOTE — ED Notes (Signed)
Per EMS, pt was doing yard work and had 2 witnessed seizures. Hx of seizures, last seizure was last week. Per pt, he is compliant with meds. Pt fell on concrete, first seizure lasted 4-5 minutes, per witnesses, and the second lasted an unknown amount of time. Post-ictal and unable to answer questions or follow commands on scene, A&O x 4 on arrival. Denies headache, n/v, vision disturbances. CBG-210, BP- 138/76
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
## 2015-05-03 DIAGNOSIS — E1165 Type 2 diabetes mellitus with hyperglycemia: Secondary | ICD-10-CM | POA: Diagnosis not present

## 2015-05-03 DIAGNOSIS — Z7984 Long term (current) use of oral hypoglycemic drugs: Secondary | ICD-10-CM | POA: Diagnosis not present

## 2015-05-20 ENCOUNTER — Ambulatory Visit: Payer: Medicare Other | Admitting: Neurology

## 2015-06-03 ENCOUNTER — Telehealth: Payer: Self-pay | Admitting: Neurology

## 2015-06-03 NOTE — Telephone Encounter (Signed)
Returned call but no answer and VM mailbox not set up to leave mssg.
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
## 2015-06-06 NOTE — Telephone Encounter (Signed)
Tried to reach pt but no answer. Left VM to return call. Also spoke to pt's mother. She says that pt had a seizure in the yard last week. He has "gotten over it now" but she voices concern about pt missing his appt earlier this month. She feels that pt needs to have his levels checked. Will try to reach pt to reschedule this month's appt.

## 2015-06-20 ENCOUNTER — Other Ambulatory Visit: Payer: Self-pay | Admitting: Neurology

## 2015-06-22 ENCOUNTER — Other Ambulatory Visit: Payer: Self-pay | Admitting: Neurology

## 2015-06-23 ENCOUNTER — Other Ambulatory Visit: Payer: Self-pay | Admitting: Neurology

## 2015-07-07 ENCOUNTER — Telehealth: Payer: Self-pay | Admitting: *Deleted

## 2015-07-07 NOTE — Telephone Encounter (Signed)
I called the patient. The seizures are not controlled, he should not be operating a motor vehicle or a motorcycle. I do not think he is an adequate candidate for a driver's license. If he can go for greater than one year without any seizures including "phased out's" then he may drive.

## 2015-08-05 ENCOUNTER — Other Ambulatory Visit: Payer: Self-pay | Admitting: Neurology

## 2015-08-25 ENCOUNTER — Other Ambulatory Visit: Payer: Self-pay | Admitting: Neurology

## 2015-09-05 ENCOUNTER — Other Ambulatory Visit: Payer: Self-pay | Admitting: Neurology

## 2015-09-13 ENCOUNTER — Encounter: Payer: Self-pay | Admitting: Neurology

## 2015-09-13 ENCOUNTER — Ambulatory Visit (INDEPENDENT_AMBULATORY_CARE_PROVIDER_SITE_OTHER): Payer: Commercial Managed Care - HMO | Admitting: Neurology

## 2015-09-13 VITALS — BP 142/84 | HR 83 | Ht 68.0 in | Wt 251.5 lb

## 2015-09-13 DIAGNOSIS — G40319 Generalized idiopathic epilepsy and epileptic syndromes, intractable, without status epilepticus: Secondary | ICD-10-CM

## 2015-09-13 DIAGNOSIS — G2581 Restless legs syndrome: Secondary | ICD-10-CM | POA: Diagnosis not present

## 2015-09-13 DIAGNOSIS — G479 Sleep disorder, unspecified: Secondary | ICD-10-CM | POA: Diagnosis not present
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
## 2015-09-13 NOTE — Patient Instructions (Signed)
   Do not operate a motor cycle or motor vehicle.

## 2015-09-26 ENCOUNTER — Other Ambulatory Visit: Payer: Self-pay | Admitting: Pharmacist

## 2015-09-26 NOTE — Patient Outreach (Signed)
Outreach call to [employer] regarding his request for follow up from the [facility] Medication Adherence Campaign. Called and spoke with patient. HIPAA identifiers verified and verbal consent received.  Mr. Eric Fernandez reports that he takes his amlodipine and lisinopril each once daily in the morning. Patient denies any barriers to adherence such as cost or side effects. Reports that he does not believe that he misses doses very often. Reports that he uses weekly pillboxes to organize his medications. Discuss with patient the importance of adherence and the benefit of using the pillbox to help him to remember and to help him to know if he misses a dose. Patient reports that he receives his medications through [facility] mail order.  Patient denies any medication questions or concerns at this time. Provide patient with my phone number.  Harlow Asa, PharmD Clinical Pharmacist [facility] [phone]

## 2015-10-03 ENCOUNTER — Encounter: Payer: Self-pay | Admitting: Neurology

## 2015-10-03 ENCOUNTER — Ambulatory Visit (INDEPENDENT_AMBULATORY_CARE_PROVIDER_SITE_OTHER): Payer: Commercial Managed Care - HMO | Admitting: Neurology

## 2015-10-03 VITALS — BP 133/82 | HR 84 | Resp 18 | Ht 68.0 in | Wt 251.0 lb

## 2015-10-03 DIAGNOSIS — Z9189 Other specified personal risk factors, not elsewhere classified: Secondary | ICD-10-CM

## 2015-10-03 DIAGNOSIS — Z789 Other specified health status: Secondary | ICD-10-CM

## 2015-10-03 DIAGNOSIS — R569 Unspecified convulsions: Secondary | ICD-10-CM

## 2015-10-03 DIAGNOSIS — G2581 Restless legs syndrome: Secondary | ICD-10-CM

## 2015-10-03 DIAGNOSIS — G4710 Hypersomnia, unspecified: Secondary | ICD-10-CM | POA: Diagnosis not present

## 2015-10-03 NOTE — Patient Instructions (Signed)
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
## 2015-10-07 ENCOUNTER — Other Ambulatory Visit: Payer: Self-pay | Admitting: Neurology

## 2015-10-10 NOTE — Telephone Encounter (Signed)
RXs printed, signed, faxed to pharmacy.

## 2015-10-10 NOTE — Telephone Encounter (Signed)
Last OV Aug 2017 Follow-up scheduled w/ Megan NP Feb 2018 Last 90 day RXs 02/17/15 w/ 1 refill

## 2015-10-25 ENCOUNTER — Ambulatory Visit (INDEPENDENT_AMBULATORY_CARE_PROVIDER_SITE_OTHER): Payer: Commercial Managed Care - HMO | Admitting: Neurology

## 2015-10-25 DIAGNOSIS — G4733 Obstructive sleep apnea (adult) (pediatric): Secondary | ICD-10-CM

## 2015-10-25 DIAGNOSIS — G4720 Circadian rhythm sleep disorder, unspecified type: Secondary | ICD-10-CM

## 2015-10-25 DIAGNOSIS — G4710 Hypersomnia, unspecified: Secondary | ICD-10-CM

## 2015-10-25 DIAGNOSIS — G4761 Periodic limb movement disorder: Secondary | ICD-10-CM

## 2015-10-31 ENCOUNTER — Emergency Department (HOSPITAL_COMMUNITY)
Admission: EM | Admit: 2015-10-31 | Discharge: 2015-10-31 | Disposition: A | Discharge status: Home / Self Care | Payer: Commercial Managed Care - HMO | Attending: Emergency Medicine | Admitting: Emergency Medicine

## 2015-10-31 ENCOUNTER — Encounter (HOSPITAL_COMMUNITY): Payer: Self-pay

## 2015-10-31 DIAGNOSIS — Y929 Unspecified place or not applicable: Secondary | ICD-10-CM | POA: Insufficient documentation

## 2015-10-31 DIAGNOSIS — X58XXXA Exposure to other specified factors, initial encounter: Secondary | ICD-10-CM | POA: Diagnosis not present

## 2015-10-31 DIAGNOSIS — Y9389 Activity, other specified: Secondary | ICD-10-CM | POA: Diagnosis not present

## 2015-10-31 DIAGNOSIS — G40909 Epilepsy, unspecified, not intractable, without status epilepticus: Secondary | ICD-10-CM | POA: Insufficient documentation

## 2015-10-31 DIAGNOSIS — Z79899 Other long term (current) drug therapy: Secondary | ICD-10-CM | POA: Diagnosis not present

## 2015-10-31 DIAGNOSIS — R569 Unspecified convulsions: Secondary | ICD-10-CM

## 2015-10-31 DIAGNOSIS — Z7984 Long term (current) use of oral hypoglycemic drugs: Secondary | ICD-10-CM | POA: Diagnosis not present

## 2015-10-31 DIAGNOSIS — I10 Essential (primary) hypertension: Secondary | ICD-10-CM | POA: Diagnosis not present

## 2015-10-31 DIAGNOSIS — Y999 Unspecified external cause status: Secondary | ICD-10-CM | POA: Diagnosis not present

## 2015-10-31 DIAGNOSIS — S00512A Abrasion of oral cavity, initial encounter: Secondary | ICD-10-CM | POA: Insufficient documentation

## 2015-10-31 DIAGNOSIS — E119 Type 2 diabetes mellitus without complications: Secondary | ICD-10-CM | POA: Insufficient documentation

## 2015-10-31 DIAGNOSIS — Z87891 Personal history of nicotine dependence: Secondary | ICD-10-CM | POA: Diagnosis not present

## 2015-10-31 LAB — RAPID URINE DRUG SCREEN, HOSP PERFORMED
Amphetamines: NOT DETECTED
BARBITURATES: NOT DETECTED
BENZODIAZEPINES: NOT DETECTED
COCAINE: NOT DETECTED
OPIATES: NOT DETECTED
Tetrahydrocannabinol: NOT DETECTED

## 2015-10-31 LAB — ETHANOL

## 2015-10-31 LAB — BASIC METABOLIC PANEL
ANION GAP: 13 (ref 5–15)
BUN: 15 mg/dL (ref 6–20)
CHLORIDE: 104 mmol/L (ref 101–111)
CO2: 19 mmol/L — ABNORMAL LOW (ref 22–32)
Calcium: 8.9 mg/dL (ref 8.9–10.3)
Creatinine, Ser: 0.91 mg/dL (ref 0.61–1.24)
Glucose, Bld: 231 mg/dL — ABNORMAL HIGH (ref 65–99)
POTASSIUM: 4.6 mmol/L (ref 3.5–5.1)
SODIUM: 136 mmol/L (ref 135–145)

## 2015-10-31 LAB — CBC WITH DIFFERENTIAL/PLATELET
Basophils Absolute: 0 10*3/uL (ref 0.0–0.1)
Basophils Relative: 0 %
EOS ABS: 0 10*3/uL (ref 0.0–0.7)
Eosinophils Relative: 0 %
HCT: 46.6 % (ref 39.0–52.0)
Hemoglobin: 16 g/dL (ref 13.0–17.0)
Lymphocytes Relative: 7 %
Lymphs Abs: 1.1 10*3/uL (ref 0.7–4.0)
MCH: 31.4 pg (ref 26.0–34.0)
MCHC: 34.3 g/dL (ref 30.0–36.0)
MCV: 91.4 fL (ref 78.0–100.0)
MONO ABS: 0.7 10*3/uL (ref 0.1–1.0)
Monocytes Relative: 4 %
NEUTROS PCT: 89 %
Neutro Abs: 14.6 10*3/uL — ABNORMAL HIGH (ref 1.7–7.7)
PLATELETS: 238 10*3/uL (ref 150–400)
RBC: 5.1 MIL/uL (ref 4.22–5.81)
RDW: 12.9 % (ref 11.5–15.5)
WBC Morphology: INCREASED
WBC: 16.4 10*3/uL — AB (ref 4.0–10.5)

## 2015-10-31 LAB — PHENYTOIN LEVEL, TOTAL: Phenytoin Lvl: 10 ug/mL (ref 10.0–20.0)

## 2015-10-31 LAB — VALPROIC ACID LEVEL: Valproic Acid Lvl: <10 ug/mL — ABNORMAL LOW (ref 50.0–100.0)

## 2015-10-31 MED ORDER — LORAZEPAM 2 MG/ML IJ SOLN
INTRAMUSCULAR | Status: AC
  Filled 2015-10-31: qty 1

## 2015-10-31 MED ORDER — SODIUM CHLORIDE 0.9 % IV SOLN
1000.0000 mg | Freq: Once | INTRAVENOUS | Status: AC
  Administered 2015-10-31: 1000 mg via INTRAVENOUS
  Filled 2015-10-31: qty 10

## 2015-10-31 MED ORDER — LORAZEPAM 2 MG/ML IJ SOLN
2.0000 mg | Freq: Once | INTRAMUSCULAR | Status: AC
  Administered 2015-10-31: 2 mg via INTRAVENOUS

## 2015-10-31 NOTE — ED Notes (Signed)
Patient is talking without difficulty. Patient put on 2L [place] from a nonrebreather.
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
## 2015-11-01 ENCOUNTER — Telehealth: Payer: Self-pay | Admitting: Neurology

## 2015-11-01 DIAGNOSIS — G4733 Obstructive sleep apnea (adult) (pediatric): Secondary | ICD-10-CM

## 2015-11-01 NOTE — Telephone Encounter (Signed)
Pt's mother called stating pt was seen in the ER yesterday for seizures. She wants to know if he should be taking Lamictal? Pt has f/u appt on Thursday. Please call pt [phone]

## 2015-11-01 NOTE — Telephone Encounter (Signed)
Spoke to pt's mother. Let her know that pt was switched from generic to brand Lamictal after his February appt d/t low normal lab levels. Pt does have Lamictal 200 mg ordered to take twice a day. She verbalized understanding. Will have pt put med back into his weekly med planner to restart tonight. He has a follow-up appt scheduled w/ Dr. Jannifer Franklin on Thurs at which time we can review meds and re-educate pt.

## 2015-11-01 NOTE — Telephone Encounter (Signed)
Patient referred by Dr. Jannifer Franklin, seen by me on 10/03/15, diagnostic PSG on 10/25/15.   Please call and notify the patient that the recent sleep study did confirm the diagnosis of moderate to severe obstructive sleep apnea and that I recommend treatment for this in the form of CPAP. This will require a repeat sleep study for proper titration and mask fitting. Please explain to patient and arrange for a CPAP titration study. I have placed an order in the chart. Thanks, and please route to [facility] for scheduling next sleep study.  If the patient continues to voice hesitation about CPAP therapy please offer pre-titration visit with Shirlean Mylar for desensitization and if agreeable ask Robin to set up appt.   Star Age, MD, PhD [facility])

## 2015-11-01 NOTE — Progress Notes (Signed)
  Patient Name: Eric Fernandez DOB: [birth year] Study Date: 10/25/2015 Referred By: Dr. Margette Fast MRN: [id number]  The patient is a 40 year old Male with a history of anxiety, depression, type 2 diabetes, hypertension, seizure disorder, vertigo, restless leg syndrome and obesity, who reports significant daytime somnolence. His height is 68 inches tall and a reported weight of 251 pounds, BMI 38.1.  Neck size 18.2 inches.  Medications include: Amlodipine, Cholecalciferol, Dilantin, Escitalopram, Farxiga, Januvia, Lacosamide, Lamotrigine, Lisinopril, Metformin, Multi-Vitamin, Phenytoin, Pramipexole, Pravastatin, Vimpat and Zolpidem.  On the night of the study, the Epworth Sleepiness Scale was 0/24.  The patient underwent an attended diagnostic digital polysomnography with the simultaneous recording of electroencephalogram, electro-oculogram, electromyogram, electrocardiogram, respiratory effort, thermister respiratory flow, nasal pressure, pulse oximetry, leg movement, body position, sound, and video.  The data was adequate for interpretation; sleep scoring followed the standards put forth by the American Academy of Sleep Medicine (AASM). ?????  SLEEP ARCHITECTURE The total time in bed was 397 minutes, the total sleep time was 342 minutes and WASO was 4 minutes.  Sleep efficiency was 86.1%.  The sleep latency was 40 minutes.  Latency to persistent sleep 40 minutes. Arousal Index was 27.4/hr. and spontaneous arousal index was 7.5/hr.  While asleep, the patient spent 4.5% of the time in stage N1 sleep, 83.% of the time in stage N2 sleep, which is increased, 0% of the time in stage N3 sleep, and 12.4% of the time in REM sleep, which is decreased. The REM latency was prolonged at 161.  BODY POSITION The patient spent 163.5 minutes in the supine position, 0 minutes on the left side, 178.5 minutes on the right side, and 0 minutes in the prone position.  RESPIRATORY PARAMETERS There were 5 obstructive  apneas, 105 hypopneas, 0 central apneas, and 0 mixed apneas.  The overall apnea-hypopnea index (AHI) was 19.3, which is moderately increased.  The AHI supine was 38.2 and the REM AHI was 29.6.  The respiratory disturbance index (RDI) was 19.3.  The patient's baseline O2 saturation while awake was 96% and the lowest O2 saturation observed was 84%.  The percentage of sleep time with O2 saturation at 89% or lower was 6.9%.      LEG MOVEMENTS There were 66 periodic leg movements.  PLMS, were observed at an average of 11.6 per hour while asleep and a PLM arousal index of 0.5 per hour.    CARDIAC EVENTS The average heart rate during sleep was 76 bpm with the highest being 95 bpm and the lowest being 61 bpm.  IMPRESSION  1. Obstructive Sleep Apnea,  2. Periodic Limb Movement Disorder 3. Dysfunctions associated with sleep stages or arousal from sleep  COMMENTS  1.  This patient has moderate to severe OSA and treatment with positive airway pressure is recommended. This will require a repeat sleep study for proper titration and mask fitting. The patient may benefit from pre-titration desensitization as he reports likely not being able to tolerate CPAP.  2.  Mild PLMS were noted without significant arousals; clinical correlation is recommended.  3.  The patient will be seen in follow up in sleep clinic. Dr. Jannifer Franklin and patient's PCP will be notified of the results.  ?????     _______________________________  Eric Age, MD, PhD Diplomate, American Board of Sleep Medicine and Neurology

## 2015-11-03 ENCOUNTER — Encounter: Payer: Self-pay | Admitting: Neurology

## 2015-11-03 ENCOUNTER — Ambulatory Visit (INDEPENDENT_AMBULATORY_CARE_PROVIDER_SITE_OTHER): Payer: Commercial Managed Care - HMO | Admitting: Neurology

## 2015-11-03 VITALS — BP 132/75 | HR 94 | Ht 68.0 in | Wt 238.0 lb

## 2015-11-03 DIAGNOSIS — G40319 Generalized idiopathic epilepsy and epileptic syndromes, intractable, without status epilepticus: Secondary | ICD-10-CM

## 2015-11-03 DIAGNOSIS — G2581 Restless legs syndrome: Secondary | ICD-10-CM | POA: Diagnosis not present
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
## 2015-11-03 NOTE — Telephone Encounter (Signed)
I spoke to patient and he is aware of results and recommendations. He is scheduled for titration study. But due to his hesitation about starting CPAP, Dr. Rexene Alberts offered for patient meet with our sleep manager and try CPAP during the day. He was very appreciative and is willing to do that.

## 2015-11-04 ENCOUNTER — Telehealth: Payer: Self-pay

## 2015-11-04 NOTE — Telephone Encounter (Signed)
-----   Message from Laurence Spates, RN sent at 11/03/2015  1:28 PM EDT ----- Eric Fernandez,  Dr Rexene Alberts would like for patient to do PAP nap due to his hesitation about starting CPAP. He had PSG, titration study is scheduled for 10/4. She would like to do PAP nap before titration study.  Thank you

## 2015-11-08 ENCOUNTER — Telehealth: Payer: Self-pay | Admitting: *Deleted

## 2015-11-08 NOTE — Telephone Encounter (Signed)
Spoke w/ patient. Encouraged OTC meds such as Tylenol and/or ibuprofen for pain relief as well as Orajel for local anesthetic. Also recommend that pt eat soft, cooled foods until healed. Reports "part of his tongue is missing d/t biting it during seizure last wk. Not sure if it is swollen but it looks awful." Denies fever or s/s of infection.

## 2015-11-08 NOTE — Telephone Encounter (Signed)
I agree with nursing assessment. If signs of infection, the patient may need to go to an urgent care office.

## 2015-11-09 ENCOUNTER — Other Ambulatory Visit: Payer: Self-pay | Admitting: Neurology

## 2015-11-16 ENCOUNTER — Ambulatory Visit (INDEPENDENT_AMBULATORY_CARE_PROVIDER_SITE_OTHER): Payer: Commercial Managed Care - HMO | Admitting: Neurology

## 2015-11-16 DIAGNOSIS — G4733 Obstructive sleep apnea (adult) (pediatric): Secondary | ICD-10-CM | POA: Diagnosis not present

## 2015-11-16 DIAGNOSIS — G4720 Circadian rhythm sleep disorder, unspecified type: Secondary | ICD-10-CM

## 2015-11-21 ENCOUNTER — Other Ambulatory Visit: Payer: Self-pay | Admitting: Neurology

## 2015-11-22 ENCOUNTER — Telehealth: Payer: Self-pay | Admitting: Neurology

## 2015-11-22 DIAGNOSIS — Z5181 Encounter for therapeutic drug level monitoring: Secondary | ICD-10-CM

## 2015-11-22 NOTE — Telephone Encounter (Signed)
I called the patient. He is to come in for blood work for a Dilantin and Lamictal level.

## 2015-11-23 ENCOUNTER — Other Ambulatory Visit (INDEPENDENT_AMBULATORY_CARE_PROVIDER_SITE_OTHER): Payer: Self-pay

## 2015-11-23 DIAGNOSIS — Z5181 Encounter for therapeutic drug level monitoring: Secondary | ICD-10-CM | POA: Diagnosis not present

## 2015-11-23 DIAGNOSIS — Z0289 Encounter for other administrative examinations: Secondary | ICD-10-CM

## 2015-11-25 LAB — PHENYTOIN LEVEL, TOTAL: Phenytoin (Dilantin), Serum: 16.7 ug/mL (ref 10.0–20.0)

## 2015-11-25 LAB — LAMOTRIGINE LEVEL: Lamotrigine Lvl: 3.1 ug/mL (ref 2.0–20.0)

## 2015-11-28 ENCOUNTER — Telehealth: Payer: Self-pay | Admitting: *Deleted

## 2015-11-28 NOTE — Telephone Encounter (Signed)
I called the mother. She has read about RNS which is responsive neuro stimulation. This involves implanting electrodes in the brain that suppressed partial onset seizures. Mr. Spaulding has generalized seizures area he may not be a candidate for this procedure.

## 2015-11-30 ENCOUNTER — Telehealth: Payer: Self-pay | Admitting: Neurology

## 2015-11-30 DIAGNOSIS — G4733 Obstructive sleep apnea (adult) (pediatric): Secondary | ICD-10-CM

## 2015-11-30 NOTE — Telephone Encounter (Signed)
Please call and inform patient that I have entered an order for treatment with positive airway pressure (PAP) treatment of obstructive sleep apnea (OSA) in the form of autoPAP. He did come for a CPAP study, but ended the study prematurely. Did not like all the sensors and requested to go home. Has moderate OSA per baseline PSG from 10/25/15 and CPAP attempted on 11/16/15, even had pre-study desensitization visit with Robin. We will arrange for a machine for home use through a DME (durable medical equipment) company of His choice; and I will see the patient back in follow-up in about 8-10 weeks. Please also explain to the patient that I will be looking out for compliance data, which can be downloaded from the machine (stored on an SD card, that is inserted in the machine) or via remote access through a modem, that is built into the machine. At the time of the followup appointment we will discuss sleep study results and how it is going with PAP treatment at home. Please advise patient to bring His machine at the time of the first FU visit, even though this is cumbersome. Bringing the machine for every visit after that will likely not be needed, but often helps for the first visit to troubleshoot if needed. Please re-enforce the importance of compliance with treatment and the need for [place] to monitor compliance data - often an insurance requirement and actually good feedback for the patient as far as how they are doing.  Also remind patient, that any interim PAP machine or mask issues should be first addressed with the DME company, as they can often help better with technical and mask fit issues. Please ask if patient has a preference regarding DME company.  Please also make sure, the patient has a follow-up appointment with me in about 8-10 weeks from the setup date, thanks.  Once you have spoken to the patient - and faxed/routed report to PCP and referring MD (if other than PCP), you can close this encounter, thanks,    Star Age, MD, PhD [facility] ([place])

## 2015-11-30 NOTE — Telephone Encounter (Signed)
I spoke to patient and he is aware of results and recommendations. He is willing to start treatment at home. I have sent orders to [facility]. I sent report to PCP. I will send the patient a letter reminding him to make f/u appt and stress the importance of compliance.

## 2015-11-30 NOTE — Progress Notes (Signed)
PATIENT'S NAME:  Eric Fernandez, Eric Fernandez DOB:      November 27, 1975      MR#:    [id number]     DATE OF RECORDING: 11/16/2015 REFERRING M.D.:  Steward Ros Study Performed:   CPAP  Titration HISTORY: The patient is a 40 year old Male with a history of anxiety, depression, type 2 diabetes, hypertension, seizure disorder, vertigo, restless leg syndrome and obesity, who returns for full night CPAP titration for his previously diagnosed OSA. The patient had a baseline sleep study on 10-25-14 which revealed an AHI of 19.3. The patient's weight 251 pounds with a height of 68 (inches), resulting in a BMI of 38.1 kg/m2. The patient's neck circumference measured 18.2 inches. He had a desensitization appointment in the sleep lab during the day before returning for his CPAP titration study.   CURRENT MEDICATIONS: Amlokipine, Cholecalciferol, Dilantin, Escitalopram, Farxiga, Januvia, Lacosamide, Lamotrigine, Lisinopril, Metformin, Multi-Vitamin, Phenytoin, Pramipexole, Pravastatin, Vimpat and Zolpidem    PROCEDURE:  This is a multichannel digital polysomnogram utilizing the SomnoStar 11.2 system.  Electrodes and sensors were applied and monitored per AASM Specifications.   EEG, EOG, Chin and Limb EMG, were sampled at 200 Hz.  ECG, Snore and Nasal Pressure, Thermal Airflow, Respiratory Effort, CPAP Flow and Pressure, Oximetry was sampled at 50 Hz. Digital video and audio were recorded.      CPAP was initiated at 5 cmH20 with heated humidity per AASM split night standards and pressure was advanced to 5 cmH20 because of hypopneas, apneas and desaturations. At a PAP pressure of 5 cmH20, there was a reduction of the AHI to 0 per hour.   Lights Out was at 20:41 and Lights On at 00:11. Total recording time (TRT) was 202.5 minutes, with a total sleep time (TST) of 197.5 minutes. Please note, that this study was prematurely ended, as the patient requested to go home. He had light stage sleep and no REM sleep was achieved, near absence  of slow wave sleep. The patient's sleep latency was 0.5 minutes with 0 minutes of wake time after sleep onset. REM latency was 0 minutes.  The sleep efficiency was 97.5 %.    SLEEP ARCHITECTURE: Wake after sleep onset was 0 min. Stage N1 .3%, Stage N2 98.2%, Stage N3 1.5% and Stage R (REM sleep) 0%.   RESPIRATORY ANALYSIS:  There was a total of 0 respiratory events: 0 obstructive apneas, 0 central apneas and 0 mixed apneas with a total of 0 apneas and an apnea index (AI) of 0. There were 0 hypopneas with a hypopnea index of 0. The patient also had 0 respiratory event related arousals (RERAs).      The total APNEA/HYPOPNEA INDEX  (AHI) was 0 and the total RESPIRATORY DISTURBANCE INDEX was 0.  0 events occurred in REM sleep and 0 events in NREM. The REM AHI was 0, versus a non-REM AHI of 0. The patient spent 32% of total sleep time in the supine position. The supine AHI was 0.0, versus a non-supine AHI of 0.0.  OXYGEN SATURATION & C02:  The baseline 02 saturation was 97%, with the lowest being 87%. Time spent below 89% saturation equaled 2 minutes.   PERIODIC LIMB MOVEMENTS:    The patient had a total of 0 Periodic Limb Movements. The Periodic Limb Movement (PLM) index was 0 and the PLM Arousal index was 0  The Technologist noted the patient was fitted with a F&P simplus small apparatus.  DIAGNOSIS 1.  Obstructive Sleep Apnea  2.  Dysfunctions associated  with sleep stages or arousal from sleep   PLANS/RECOMMENDATIONS: 1.  This study shows improved OSA on CPAP of 5 cm. However, the study was prematurely ended, per patient's request, and for all practical purposes only stage N2 sleep (light stage) sleep was achieved. I will recommend, that the patient start on home AutoPAP therapy and be followed clinically and via PAP downloads. The patient should be cautioned not to drive, work at heights, or operate dangerous or heavy equipment when tired or sleepy. Review and reiteration of good sleep hygiene  measures should be pursued with any patient. 2. Please note that untreated obstructive sleep apnea carries additional perioperative morbidity. Patients with significant obstructive sleep apnea should receive perioperative PAP therapy and the surgeons and particularly the anesthesiologist should be informed of the diagnosis and the severity of the sleep disordered breathing. 3. Other OSA treatment options may include avoidance of supine sleep position along with weight loss, upper airway or jaw surgery in selected patients or the use of an oral appliance in certain patients. ENT evaluation and/or consultation with a maxillofacial surgeon or dentist may be feasible and some instances.    4. A follow up appointment will be scheduled in the Sleep Clinic at [facility]. The referring provider will be notified of the test results.    I certify that I have reviewed the entire raw data recording prior to the issuance of this report in accordance with the Standards of Accreditation of the American Academy of Sleep Medicine (AASM)    Star Age, MD, PhD Diplomat, American Board of Psychiatry and Neurology  Diplomat, [facility]

## 2015-12-01 ENCOUNTER — Telehealth: Payer: Self-pay

## 2015-12-01 NOTE — Telephone Encounter (Signed)
-----   Message from Kathrynn Ducking, MD sent at 11/25/2015  7:28 AM EDT -----  Dilantin and lamotrigine levels are therapeutic, no dose adjustments for now. Please call the patient. ----- Message ----- From: Lavone Neri Lab Results In Sent: 11/24/2015   7:43 AM To: Kathrynn Ducking, MD

## 2015-12-01 NOTE — Telephone Encounter (Signed)
Called and spoke to pt's mother. Reviewed therapeutic levels and to continue medications as prescribed.

## 2015-12-02 DIAGNOSIS — G4733 Obstructive sleep apnea (adult) (pediatric): Secondary | ICD-10-CM | POA: Diagnosis not present

## 2015-12-07 DIAGNOSIS — G4733 Obstructive sleep apnea (adult) (pediatric): Secondary | ICD-10-CM | POA: Diagnosis not present

## 2015-12-08 ENCOUNTER — Other Ambulatory Visit: Payer: Self-pay | Admitting: Neurology

## 2015-12-12 ENCOUNTER — Emergency Department (HOSPITAL_COMMUNITY)
Admission: EM | Admit: 2015-12-12 | Discharge: 2015-12-12 | Discharge status: Absent without leave | Payer: Commercial Managed Care - HMO

## 2015-12-12 DIAGNOSIS — R569 Unspecified convulsions: Secondary | ICD-10-CM | POA: Diagnosis not present

## 2015-12-12 DIAGNOSIS — S0091XA Abrasion of unspecified part of head, initial encounter: Secondary | ICD-10-CM | POA: Diagnosis not present

## 2015-12-12 NOTE — ED Notes (Signed)
Bed: WA01 Expected date:  Expected time:  Means of arrival:  Comments: EMS- 40yo M, Seizure

## 2016-01-02 DIAGNOSIS — G4733 Obstructive sleep apnea (adult) (pediatric): Secondary | ICD-10-CM | POA: Diagnosis not present

## 2016-01-09 ENCOUNTER — Emergency Department (HOSPITAL_COMMUNITY)
Admission: EM | Admit: 2016-01-09 | Discharge: 2016-01-09 | Disposition: A | Discharge status: Home / Self Care | Payer: Commercial Managed Care - HMO | Attending: Emergency Medicine | Admitting: Emergency Medicine

## 2016-01-09 ENCOUNTER — Encounter (HOSPITAL_COMMUNITY): Payer: Self-pay | Admitting: Emergency Medicine

## 2016-01-09 DIAGNOSIS — Z79899 Other long term (current) drug therapy: Secondary | ICD-10-CM | POA: Insufficient documentation

## 2016-01-09 DIAGNOSIS — I10 Essential (primary) hypertension: Secondary | ICD-10-CM | POA: Insufficient documentation

## 2016-01-09 DIAGNOSIS — Z5321 Procedure and treatment not carried out due to patient leaving prior to being seen by health care provider: Secondary | ICD-10-CM | POA: Diagnosis not present

## 2016-01-09 DIAGNOSIS — E119 Type 2 diabetes mellitus without complications: Secondary | ICD-10-CM | POA: Insufficient documentation

## 2016-01-09 DIAGNOSIS — R569 Unspecified convulsions: Secondary | ICD-10-CM | POA: Diagnosis not present

## 2016-01-09 DIAGNOSIS — Z969 Presence of functional implant, unspecified: Secondary | ICD-10-CM | POA: Insufficient documentation

## 2016-01-09 DIAGNOSIS — Z87891 Personal history of nicotine dependence: Secondary | ICD-10-CM | POA: Insufficient documentation

## 2016-01-09 DIAGNOSIS — Z7984 Long term (current) use of oral hypoglycemic drugs: Secondary | ICD-10-CM | POA: Insufficient documentation

## 2016-01-09 LAB — CBG MONITORING, ED: GLUCOSE-CAPILLARY: 184 mg/dL — AB (ref 65–99)

## 2016-01-09 NOTE — ED Notes (Signed)
Patient yelling at his mother in the hallway. He want to leave AMA.

## 2016-01-09 NOTE — ED Provider Notes (Signed)
Patient left without being seen, I did not see or evaluate them.    Leo Grosser, MD 01/09/16 [phone]

## 2016-01-09 NOTE — ED Notes (Signed)
Bed: WA07 Expected date:  Expected time:  Means of arrival:  Comments: EMS- seizure/Hx of same and noncompliant
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
## 2016-01-09 NOTE — ED Triage Notes (Signed)
Traces of blood noted on patient's lips from biting his lip.  Bleeding controlled at this time.  States he takes several seizure medications.

## 2016-01-10 ENCOUNTER — Telehealth: Payer: Self-pay | Admitting: Neurology

## 2016-01-10 NOTE — Telephone Encounter (Signed)
Pt's mother called to advise he had a seizure yesterday but left the hospital before he could be evaluated. She said he had a seizure last week. She said he is up to date on his medicine, she checked his pillbox. She said she thinks he needs to see Dr Jannifer Franklin soon. She wants RN to call him and make an appt to be seen, she said he won't do anything for her, she is worried.

## 2016-01-10 NOTE — Telephone Encounter (Signed)
I reviewed patient's compliance data on AutoPap from 12/04/2015 through 01/02/2016 which is a total of 30 days, during which time he used his machine 29 days with percent used days greater than 4 hours at 53%, indicating suboptimal compliance with an average usage of 3 hours and 47 minutes only, residual AHI 0.4 per hour, acceptable, 95th percentile of pressure at 10.1 cm, leak acceptable with the 95th percentile at 9.5 L/m, pressure range of 4 to 15 cm with EPR. Please call patient to remind him to be fully compliant with AutoPap and use it through the night every night. He has an appointment with me in early December to discuss this as well.
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
## 2016-01-11 ENCOUNTER — Other Ambulatory Visit: Payer: Self-pay | Admitting: Neurology

## 2016-01-11 NOTE — Telephone Encounter (Signed)
I spoke to patient and he is aware of results and recommendations below.

## 2016-01-18 ENCOUNTER — Ambulatory Visit (INDEPENDENT_AMBULATORY_CARE_PROVIDER_SITE_OTHER): Payer: Commercial Managed Care - HMO | Admitting: Neurology

## 2016-01-18 ENCOUNTER — Encounter: Payer: Self-pay | Admitting: Neurology

## 2016-01-18 VITALS — BP 132/80 | HR 84 | Resp 29 | Ht 68.0 in | Wt 249.0 lb

## 2016-01-18 DIAGNOSIS — Z9989 Dependence on other enabling machines and devices: Secondary | ICD-10-CM | POA: Diagnosis not present

## 2016-01-18 DIAGNOSIS — G40319 Generalized idiopathic epilepsy and epileptic syndromes, intractable, without status epilepticus: Secondary | ICD-10-CM

## 2016-01-18 DIAGNOSIS — G4733 Obstructive sleep apnea (adult) (pediatric): Secondary | ICD-10-CM | POA: Diagnosis not present

## 2016-01-18 NOTE — Patient Instructions (Signed)
Please continue using your CPAP regularly. While your insurance requires that you use CPAP at least 4 hours each night on 70% of the nights, I recommend, that you not skip any nights and use it throughout the night if you can. Getting used to CPAP and staying with the treatment long term does take time and patience and discipline. Untreated obstructive sleep apnea when it is moderate to severe can have an adverse impact on cardiovascular health and raise her risk for heart disease, arrhythmias, hypertension, congestive heart failure, stroke and diabetes. Untreated obstructive sleep apnea causes sleep disruption, nonrestorative sleep, and sleep deprivation. This can have an impact on your day to day functioning and cause daytime sleepiness and impairment of cognitive function, memory loss, mood disturbance, and problems focussing. Using CPAP regularly can improve these symptoms.  We will do a 3 month follow with Jinny Blossom or Hoyle Sauer, we will ask your DME company to work with you on your mask issues.    Please remember, common seizure triggers are: Sleep deprivation, dehydration, overheating, stress, hypoglycemia or skipping meals, certain medications or excessive alcohol use, especially stopping alcohol abruptly if you have had heavy alcohol use before (aka alcohol withdrawal seizure). If you have a prolonged seizure over 2-5 minutes or back to back seizures, call or have someone call 911 or take you to the nearest emergency room. You cannot drive a car or operate any other machinery or vehicle within 6 months of a seizure. Please do not swim alone or take a tub bath for safety. Do not cook with large quantities of boiling water or oil for safety. Take your medicine for seizure prevention regularly and do not skip doses or stop medication abruptly and tone are told to do so by your healthcare provider. Try to get a refill on your antiepileptic medication ahead of time, so you are not at risk of running out. If you  run out of the seizure medication and do not have a refill at hand she may run into medication withdrawal seizures. Avoid taking Wellbutrin, narcotic pain medications and tramadol, as they can lower seizure threshold.   You need to be fully compliant with your seizure medication!
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
## 2016-01-23 ENCOUNTER — Other Ambulatory Visit: Payer: Self-pay | Admitting: Neurology

## 2016-01-23 ENCOUNTER — Encounter: Payer: Self-pay | Admitting: Neurology

## 2016-01-23 ENCOUNTER — Telehealth: Payer: Self-pay | Admitting: *Deleted

## 2016-01-23 ENCOUNTER — Ambulatory Visit (INDEPENDENT_AMBULATORY_CARE_PROVIDER_SITE_OTHER): Payer: Commercial Managed Care - HMO | Admitting: Neurology

## 2016-01-23 VITALS — BP 132/76 | HR 94 | Ht 68.0 in | Wt 249.0 lb

## 2016-01-23 DIAGNOSIS — G40319 Generalized idiopathic epilepsy and epileptic syndromes, intractable, without status epilepticus: Secondary | ICD-10-CM | POA: Diagnosis not present

## 2016-01-23 MED ORDER — LAMOTRIGINE 25 MG PO TABS: 50.0000 mg | ORAL_TABLET | Freq: Two times a day (BID) | ORAL | 5 refills | Status: DC

## 2016-01-23 MED ORDER — LACOSAMIDE 150 MG PO TABS: 1.0000 | ORAL_TABLET | Freq: Two times a day (BID) | ORAL | Status: DC

## 2016-01-23 NOTE — Telephone Encounter (Signed)
Received call from patient's mother, on DPR. She voiced much concern over her son's recent seizures. She stated she is going to pick up his medication, questioned if this was "going to stop his seizures". She also questioned if his vagal nerve stimulator needed to be replaced. This RN advised her that Dr Jannifer Franklin made referral today for stimulator to be replaced, and patient will get a call to schedule from the neurosurgeon's office. Advised that this RN cannot answer her question re: medication stopping seizures, however that is what Dr Jannifer Franklin intention is. Offered for this call to be routed to Dr Jannifer Franklin' RN; she declined and stated she "will pray that his seizures will stop." advised she pick up his medication today; she stated she is going to pharmacy now and will have him begin medication tonight.

## 2016-01-23 NOTE — Patient Instructions (Addendum)
   We will add Lamictal 25 mg tablet twice a day for 2 weeks, then take two tablets twice a day twice a day.  Change the Vimpat 150 mg tablet to one twice a day
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
## 2016-01-30 ENCOUNTER — Telehealth: Payer: Self-pay | Admitting: *Deleted

## 2016-01-30 DIAGNOSIS — Z6838 Body mass index (BMI) 38.0-38.9, adult: Secondary | ICD-10-CM | POA: Diagnosis not present

## 2016-01-30 DIAGNOSIS — T859XXA Unspecified complication of internal prosthetic device, implant and graft, initial encounter: Secondary | ICD-10-CM | POA: Diagnosis not present

## 2016-01-30 DIAGNOSIS — I10 Essential (primary) hypertension: Secondary | ICD-10-CM | POA: Diagnosis not present

## 2016-01-31 ENCOUNTER — Ambulatory Visit: Payer: Self-pay | Admitting: Neurology

## 2016-01-31 MED ORDER — ESCITALOPRAM OXALATE 20 MG PO TABS: 20.0000 mg | ORAL_TABLET | Freq: Every day | ORAL | 3 refills | Status: DC

## 2016-01-31 MED ORDER — PRAMIPEXOLE DIHYDROCHLORIDE 0.75 MG PO TABS: 0.7500 mg | ORAL_TABLET | Freq: Every day | ORAL | 3 refills | Status: DC

## 2016-01-31 MED ORDER — PRAMIPEXOLE DIHYDROCHLORIDE 1 MG PO TABS: 1.0000 mg | ORAL_TABLET | Freq: Every day | ORAL | 2 refills | Status: DC

## 2016-01-31 MED ORDER — DILANTIN 100 MG PO CAPS: ORAL_CAPSULE | ORAL | 3 refills | Status: DC

## 2016-01-31 NOTE — Telephone Encounter (Signed)
I called patient. The patient wishes to go up on the Mirapex for restless legs. He will be getting a vagal nerve stimulator placed in the near future.

## 2016-01-31 NOTE — Telephone Encounter (Signed)
Called to notify pt. He mentioned that he had previously discussed increasing dose of Mirapex for RLS symptoms w/ Dr. Jannifer Franklin and would like a call back if that could be done.

## 2016-01-31 NOTE — Addendum Note (Signed)
Addended by: Margette Fast on: 01/31/2016 06:19 PM   Modules accepted: Orders

## 2016-01-31 NOTE — Telephone Encounter (Signed)
Med refills due next month. Post dated rxs e-scribed to mail order pharmacy. Also faxed letter of medical necessity for VNS to [place] F# [phone] for initiation of pre-auth through [facility].

## 2016-02-01 DIAGNOSIS — G4733 Obstructive sleep apnea (adult) (pediatric): Secondary | ICD-10-CM | POA: Diagnosis not present

## 2016-02-07 ENCOUNTER — Other Ambulatory Visit: Payer: Self-pay | Admitting: Neurology

## 2016-02-10 ENCOUNTER — Other Ambulatory Visit: Payer: Self-pay | Admitting: Neurosurgery

## 2016-03-03 DIAGNOSIS — G4733 Obstructive sleep apnea (adult) (pediatric): Secondary | ICD-10-CM | POA: Diagnosis not present

## 2016-03-05 ENCOUNTER — Other Ambulatory Visit: Payer: Self-pay | Admitting: Neurology

## 2016-03-05 ENCOUNTER — Telehealth: Payer: Self-pay | Admitting: *Deleted

## 2016-03-05 MED ORDER — HYDROCODONE-ACETAMINOPHEN 5-325 MG PO TABS: 1.0000 | ORAL_TABLET | Freq: Four times a day (QID) | ORAL | 0 refills | Status: DC | PRN

## 2016-03-05 NOTE — Telephone Encounter (Signed)
Pt reports seizure on Friday and requesting rx for injured tongue. He is scheduled on 03/13/16 for replacement of VNS battery.

## 2016-03-05 NOTE — Telephone Encounter (Signed)
I called patient. He had a seizure 3 days ago, he bit his tongue and he has a lot of pain with this. I will give him a small prescription for hydrocodone for this, he cannot take Ultram.  The vagal nerve stimulator is failing, he will have a replacement placed soon.

## 2016-03-06 NOTE — Telephone Encounter (Signed)
Rx printed, signed, up front for pick-up. 

## 2016-03-07 NOTE — Pre-Procedure Instructions (Signed)
Eric Fernandez  03/07/2016      [id number] [address], [place] - [address] [place] [address] Phone: [phone] Fax: [facility] - [place], [place] [address] Phone: [phone] Fax: [phone]    Your procedure is scheduled on Tues, Jan 30 @ 11:15 AM  Report to [facility] [facility] at 9:15 AM  Call this number if you have problems the morning of surgery:  [phone]   Remember:  Do not eat food or drink liquids after midnight.  Take these medicines the morning of surgery with A SIP OF WATER Amlodipine(Norvasc),Dilantin(Phenytoin),Lexapro(Escitalpram),Pain Pill(if needed), and Lamictal(Lamotrigine)              Stop taking Ibuprofen along with any Vitamins or Herbal Medications. No Goody's,BC's,Aleve,Advil,Motrin,or Fish Oil.                 How to Manage Your Diabetes Before and After Surgery  Why is it important to control my blood sugar before and after surgery? . Improving blood sugar levels before and after surgery helps healing and can limit problems. . A way of improving blood sugar control is eating a healthy diet by: o  Eating less sugar and carbohydrates o  Increasing activity/exercise o  Talking with your doctor about reaching your blood sugar goals . High blood sugars (greater than 180 mg/dL) can raise your risk of infections and slow your recovery, so you will need to focus on controlling your diabetes during the weeks before surgery. . Make sure that the doctor who takes care of your diabetes knows about your planned surgery including the date and location.  How do I manage my blood sugar before surgery? . Check your blood sugar at least 4 times a day, starting 2 days before surgery, to make sure that the level is not too high or low. o Check your blood sugar the morning of your surgery when you wake up and every 2  hours until you get to the Short Stay unit. . If your blood sugar is less than 70 mg/dL, you will need to treat for low blood sugar: o Do not take insulin. o Treat a low blood sugar (less than 70 mg/dL) with  cup of clear juice (cranberry or apple), 4 glucose tablets, OR glucose gel. o Recheck blood sugar in 15 minutes after treatment (to make sure it is greater than 70 mg/dL). If your blood sugar is not greater than 70 mg/dL on recheck, call [phone] for further instructions. . Report your blood sugar to the short stay nurse when you get to Short Stay.  . If you are admitted to the hospital after surgery: o Your blood sugar will be checked by the staff and you will probably be given insulin after surgery (instead of oral diabetes medicines) to make sure you have good blood sugar levels. o The goal for blood sugar control after surgery is 80-180 mg/dL.              WHAT DO I DO ABOUT MY DIABETES MEDICATION?   Marland Kitchen Do not take oral diabetes medicines (pills) the morning of surgery.  . The day of surgery, do not take other diabetes injectables, including Byetta (exenatide), Bydureon (exenatide ER), Victoza (liraglutide), or Trulicity (dulaglutide).  . If your CBG is greater than 220 mg/dL, you may take  of your sliding scale (correction) dose of  insulin.   Reviewed and Endorsed by [facility] Patient Education Committee, August 2015    Do not wear jewelry, make-up or nail polish.  Do not wear lotions, powders, or perfumes, or deoderant.  Do not shave 48 hours prior to surgery.  Men may shave face and neck.  Do not bring valuables to the hospital.  [facility] is not responsible for any belongings or valuables.  Contacts, dentures or bridgework may not be worn into surgery.  Leave your suitcase in the car.  After surgery it may be brought to your room.  For patients admitted to the hospital, discharge time will be determined by your treatment team.  Patients discharged  the day of surgery will not be allowed to drive home.    [facility]  Before surgery, you can play an important role.  Because skin is not sterile, your skin needs to be as free of germs as possible.  You can reduce the number of germs on you skin by washing with CHG (chlorahexidine gluconate) soap before surgery.  CHG is an antiseptic cleaner which kills germs and bonds with the skin to continue killing germs even after washing.  Please DO NOT use if you have an allergy to CHG or antibacterial soaps.  If your skin becomes reddened/irritated stop using the CHG and inform your nurse when you arrive at Short Stay.  Do not shave (including legs and underarms) for at least 48 hours prior to the first CHG shower.  You may shave your face.  Please follow these instructions carefully:   1.  Shower with CHG Soap the night before surgery and the                                morning of Surgery.  2.  If you choose to wash your hair, wash your hair first as usual with your       normal shampoo.  3.  After you shampoo, rinse your hair and body thoroughly to remove the                      Shampoo.  4.  Use CHG as you would any other liquid soap.  You can apply chg directly       to the skin and wash gently with scrungie or a clean washcloth.  5.  Apply the CHG Soap to your body ONLY FROM THE NECK DOWN.        Do not use on open wounds or open sores.  Avoid contact with your eyes,       ears, mouth and genitals (private parts).  Wash genitals (private parts)       with your normal soap.  6.  Wash thoroughly, paying special attention to the area where your surgery        will be performed.  7.  Thoroughly rinse your body with warm water from the neck down.  8.  DO NOT shower/wash with your normal soap after using and rinsing off       the CHG Soap.  9.  Pat yourself dry with a clean towel.            10.  Wear clean pajamas.            11.  Place clean sheets on  your bed the night of your first shower and do not  sleep with pets.  Day of Surgery  Do not apply any lotions/deoderants the morning of surgery.  Please wear clean clothes to the hospital/surgery center.   Please read over the following fact sheets that you were given. Pain Booklet and Coughing and Deep Breathing

## 2016-03-08 ENCOUNTER — Encounter (HOSPITAL_COMMUNITY)
Admission: RE | Admit: 2016-03-08 | Discharge: 2016-03-08 | Disposition: A | Discharge status: Home / Self Care | Payer: Medicare HMO | Source: Ambulatory Visit | Attending: Neurosurgery | Admitting: Neurosurgery

## 2016-03-08 ENCOUNTER — Encounter (HOSPITAL_COMMUNITY): Payer: Self-pay

## 2016-03-08 DIAGNOSIS — Z01812 Encounter for preprocedural laboratory examination: Secondary | ICD-10-CM | POA: Diagnosis not present

## 2016-03-08 DIAGNOSIS — T85192D Other mechanical complication of implanted electronic neurostimulator (electrode) of spinal cord, subsequent encounter: Secondary | ICD-10-CM | POA: Diagnosis not present

## 2016-03-08 HISTORY — DX: Sleep apnea, unspecified: G47.30

## 2016-03-08 LAB — CBC
HCT: 45.6 % (ref 39.0–52.0)
HEMOGLOBIN: 15.7 g/dL (ref 13.0–17.0)
MCH: 30.8 pg (ref 26.0–34.0)
MCHC: 34.4 g/dL (ref 30.0–36.0)
MCV: 89.4 fL (ref 78.0–100.0)
Platelets: 247 10*3/uL (ref 150–400)
RBC: 5.1 MIL/uL (ref 4.22–5.81)
RDW: 13.1 % (ref 11.5–15.5)
WBC: 7.8 10*3/uL (ref 4.0–10.5)

## 2016-03-08 LAB — BASIC METABOLIC PANEL WITH GFR
Anion gap: 11 (ref 5–15)
BUN: 12 mg/dL (ref 6–20)
CO2: 22 mmol/L (ref 22–32)
Calcium: 9.3 mg/dL (ref 8.9–10.3)
Chloride: 105 mmol/L (ref 101–111)
Creatinine, Ser: 0.73 mg/dL (ref 0.61–1.24)
GFR calc Af Amer: >60 mL/min
GFR calc non Af Amer: >60 mL/min
Glucose, Bld: 127 mg/dL — ABNORMAL HIGH (ref 65–99)
Potassium: 4.6 mmol/L (ref 3.5–5.1)
Sodium: 138 mmol/L (ref 135–145)

## 2016-03-08 LAB — GLUCOSE, CAPILLARY: Glucose-Capillary: 135 mg/dL — ABNORMAL HIGH (ref 65–99)

## 2016-03-08 NOTE — Progress Notes (Signed)
PCP: [facility] Neurology: Dr. Margette Fast  Fasting sugars: Pt. Reports he doesn't check fasting sugars routinely. When he does it's been around 140.  Pt. Reported he had a seizure couple days ago and bit his tongue. He notified Dr.  Jannifer Franklin. He stated it was due to having no battery left in the stimulator.  Notified Allison zelenack, P.A.

## 2016-03-09 LAB — HEMOGLOBIN A1C
HEMOGLOBIN A1C: 6.4 % — AB (ref 4.8–5.6)
MEAN PLASMA GLUCOSE: 137 mg/dL

## 2016-03-09 NOTE — Progress Notes (Signed)
Anesthesia chart review: Patient is a 41 year old male scheduled for replacement of vagal nerve stimulator battery on 03/13/2016 by Dr. Kathyrn Sheriff.  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 BMI is consistent with obesity.  - PCP is with [facility]. - Neurologist is Dr. Jannifer Franklin, last visit 01/23/16. Patient recently began have increased seizures. Meds were adjusted, and patient referred to neurosurgery for vagal nerve stimulator battery replacement. Advised not to drive for now.   Meds include amlodipine, Dilantin, Lexapro, Farxiga, Norco, Januvia, Vimpat, Lamictal, lisinopril, metformin, Mirapex, pravastatin, Ambien.  BP 126/75   Pulse 85   Temp 36.6 C   Resp 20   Ht 5\' 8"  (1.727 m)   Wt 256 lb 6.3 oz (116.3 kg)   SpO2 98%   BMI 38.98 kg/m   10/31/15 EKG (in ED in the setting of generalized tonic-clonic seizure): ST at 123 bpm, nonspecific intraventricular conduction delay, low voltage precordial leads, consider anterior infarct (consider reserve lead placement V2-V3), baseline wanderer in multiple leads. HR was 85 bpm at PAT.   Preoperative labs noted. A1c 6.4.   If no acute changes then I would anticipate that he can proceed as planned.  Eric Fernandez [facility] Short Stay Center/Anesthesiology Phone [phone] 03/09/2016 12:07 PM

## 2016-03-13 ENCOUNTER — Telehealth: Payer: Self-pay | Admitting: Neurology

## 2016-03-13 ENCOUNTER — Ambulatory Visit (HOSPITAL_COMMUNITY): Payer: Medicare HMO | Admitting: Certified Registered Nurse Anesthetist

## 2016-03-13 ENCOUNTER — Ambulatory Visit (HOSPITAL_COMMUNITY)
Admission: RE | Admit: 2016-03-13 | Discharge: 2016-03-13 | Disposition: A | Discharge status: Home / Self Care | Payer: Medicare HMO | Source: Ambulatory Visit | Attending: Neurosurgery | Admitting: Neurosurgery

## 2016-03-13 ENCOUNTER — Encounter (HOSPITAL_COMMUNITY): Payer: Self-pay | Admitting: *Deleted

## 2016-03-13 ENCOUNTER — Encounter (HOSPITAL_COMMUNITY)
Admission: RE | Disposition: A | Discharge status: Home / Self Care | Payer: Self-pay | Source: Ambulatory Visit | Attending: Neurosurgery

## 2016-03-13 ENCOUNTER — Ambulatory Visit (HOSPITAL_COMMUNITY): Payer: Medicare HMO | Admitting: Vascular Surgery

## 2016-03-13 DIAGNOSIS — Z6838 Body mass index (BMI) 38.0-38.9, adult: Secondary | ICD-10-CM | POA: Insufficient documentation

## 2016-03-13 DIAGNOSIS — F419 Anxiety disorder, unspecified: Secondary | ICD-10-CM | POA: Diagnosis not present

## 2016-03-13 DIAGNOSIS — G2581 Restless legs syndrome: Secondary | ICD-10-CM | POA: Insufficient documentation

## 2016-03-13 DIAGNOSIS — Z7984 Long term (current) use of oral hypoglycemic drugs: Secondary | ICD-10-CM | POA: Insufficient documentation

## 2016-03-13 DIAGNOSIS — G4730 Sleep apnea, unspecified: Secondary | ICD-10-CM | POA: Diagnosis not present

## 2016-03-13 DIAGNOSIS — Z87891 Personal history of nicotine dependence: Secondary | ICD-10-CM | POA: Insufficient documentation

## 2016-03-13 DIAGNOSIS — F329 Major depressive disorder, single episode, unspecified: Secondary | ICD-10-CM | POA: Diagnosis not present

## 2016-03-13 DIAGNOSIS — G40919 Epilepsy, unspecified, intractable, without status epilepticus: Secondary | ICD-10-CM | POA: Diagnosis not present

## 2016-03-13 DIAGNOSIS — E669 Obesity, unspecified: Secondary | ICD-10-CM | POA: Diagnosis not present

## 2016-03-13 DIAGNOSIS — I10 Essential (primary) hypertension: Secondary | ICD-10-CM | POA: Diagnosis not present

## 2016-03-13 DIAGNOSIS — E119 Type 2 diabetes mellitus without complications: Secondary | ICD-10-CM | POA: Insufficient documentation

## 2016-03-13 DIAGNOSIS — T859XXA Unspecified complication of internal prosthetic device, implant and graft, initial encounter: Secondary | ICD-10-CM | POA: Diagnosis not present

## 2016-03-13 HISTORY — PX: VAGUS NERVE STIMULATOR INSERTION: SHX348

## 2016-03-13 LAB — GLUCOSE, CAPILLARY
Glucose-Capillary: 191 mg/dL — ABNORMAL HIGH (ref 65–99)
Glucose-Capillary: 106 mg/dL — ABNORMAL HIGH (ref 65–99)
Glucose-Capillary: 144 mg/dL — ABNORMAL HIGH (ref 65–99)

## 2016-03-13 SURGERY — VAGAL NERVE STIMULATOR IMPLANT
Anesthesia: General | Site: Chest | Laterality: Left

## 2016-03-13 MED ORDER — THROMBIN 5000 UNITS EX SOLR
CUTANEOUS | Status: AC
  Filled 2016-03-13: qty 15000

## 2016-03-13 MED ORDER — PROPOFOL 10 MG/ML IV BOLUS
INTRAVENOUS | Status: AC
  Filled 2016-03-13: qty 20

## 2016-03-13 MED ORDER — MEPERIDINE HCL 25 MG/ML IJ SOLN: 6.2500 mg | INTRAMUSCULAR | Status: DC | PRN

## 2016-03-13 MED ORDER — HYDROMORPHONE HCL 1 MG/ML IJ SOLN
0.2500 mg | INTRAMUSCULAR | Status: DC | PRN
  Administered 2016-03-13: 0.5 mg via INTRAVENOUS

## 2016-03-13 MED ORDER — CHLORHEXIDINE GLUCONATE CLOTH 2 % EX PADS: 6.0000 | MEDICATED_PAD | Freq: Once | CUTANEOUS | Status: DC

## 2016-03-13 MED ORDER — FENTANYL CITRATE (PF) 100 MCG/2ML IJ SOLN
INTRAMUSCULAR | Status: DC | PRN
  Administered 2016-03-13: 100 ug via INTRAVENOUS
  Administered 2016-03-13: 50 ug via INTRAVENOUS

## 2016-03-13 MED ORDER — LACTATED RINGERS IV SOLN
INTRAVENOUS | Status: DC | PRN
  Administered 2016-03-13 (×2): via INTRAVENOUS

## 2016-03-13 MED ORDER — LACTATED RINGERS IV SOLN
INTRAVENOUS | Status: DC
  Administered 2016-03-13: 09:00:00 via INTRAVENOUS

## 2016-03-13 MED ORDER — FENTANYL CITRATE (PF) 100 MCG/2ML IJ SOLN
INTRAMUSCULAR | Status: AC
  Filled 2016-03-13: qty 4

## 2016-03-13 MED ORDER — LACTATED RINGERS IV SOLN: INTRAVENOUS | Status: DC

## 2016-03-13 MED ORDER — ONDANSETRON HCL 4 MG/2ML IJ SOLN
INTRAMUSCULAR | Status: DC | PRN
  Administered 2016-03-13: 4 mg via INTRAVENOUS

## 2016-03-13 MED ORDER — PROPOFOL 10 MG/ML IV BOLUS
INTRAVENOUS | Status: DC | PRN
  Administered 2016-03-13: 200 mg via INTRAVENOUS

## 2016-03-13 MED ORDER — LIDOCAINE HCL (CARDIAC) 20 MG/ML IV SOLN
INTRAVENOUS | Status: DC | PRN
  Administered 2016-03-13: 30 mg via INTRAVENOUS

## 2016-03-13 MED ORDER — 0.9 % SODIUM CHLORIDE (POUR BTL) OPTIME
TOPICAL | Status: DC | PRN
  Administered 2016-03-13: 1000 mL

## 2016-03-13 MED ORDER — PROMETHAZINE HCL 25 MG/ML IJ SOLN: 6.2500 mg | INTRAMUSCULAR | Status: DC | PRN

## 2016-03-13 MED ORDER — MIDAZOLAM HCL 2 MG/2ML IJ SOLN
INTRAMUSCULAR | Status: AC
  Filled 2016-03-13: qty 2

## 2016-03-13 MED ORDER — SUCCINYLCHOLINE CHLORIDE 20 MG/ML IJ SOLN
INTRAMUSCULAR | Status: DC | PRN
  Administered 2016-03-13: 120 mg via INTRAVENOUS

## 2016-03-13 MED ORDER — LIDOCAINE-EPINEPHRINE 1 %-1:100000 IJ SOLN
INTRAMUSCULAR | Status: DC | PRN
  Administered 2016-03-13: 6 mL via INTRADERMAL

## 2016-03-13 MED ORDER — HYDROMORPHONE HCL 1 MG/ML IJ SOLN
INTRAMUSCULAR | Status: AC
  Filled 2016-03-13: qty 0.5

## 2016-03-13 MED ORDER — BUPIVACAINE-EPINEPHRINE (PF) 0.5% -1:200000 IJ SOLN
INTRAMUSCULAR | Status: AC
  Filled 2016-03-13: qty 30

## 2016-03-13 MED ORDER — CEFAZOLIN SODIUM-DEXTROSE 2-4 GM/100ML-% IV SOLN
2.0000 g | INTRAVENOUS | Status: AC
  Administered 2016-03-13: 2 g via INTRAVENOUS
  Filled 2016-03-13: qty 100

## 2016-03-13 SURGICAL SUPPLY — 52 items
ADH SKN CLS APL DERMABOND .7 (GAUZE/BANDAGES/DRESSINGS) ×1
APL SKNCLS STERI-STRIP NONHPOA (GAUZE/BANDAGES/DRESSINGS)
BAG DECANTER FOR FLEXI CONT (MISCELLANEOUS) ×3 IMPLANT
BENZOIN TINCTURE PRP APPL 2/3 (GAUZE/BANDAGES/DRESSINGS) IMPLANT
BLADE CLIPPER SURG (BLADE) ×2 IMPLANT
BLADE SURG 11 STRL SS (BLADE) ×3 IMPLANT
CANISTER SUCT 3000ML PPV (MISCELLANEOUS) ×3 IMPLANT
CARTRIDGE OIL MAESTRO DRILL (MISCELLANEOUS) ×1 IMPLANT
DECANTER SPIKE VIAL GLASS SM (MISCELLANEOUS) ×3 IMPLANT
DERMABOND ADVANCED (GAUZE/BANDAGES/DRESSINGS) ×2
DERMABOND ADVANCED .7 DNX12 (GAUZE/BANDAGES/DRESSINGS) ×1 IMPLANT
DIFFUSER DRILL AIR PNEUMATIC (MISCELLANEOUS) ×1 IMPLANT
DRAPE CAMERA VIDEO/LASER (DRAPES) ×3 IMPLANT
DRAPE HALF SHEET 40X57 (DRAPES) ×2 IMPLANT
DRAPE LAPAROTOMY T 102X78X121 (DRAPES) ×3 IMPLANT
DRAPE POUCH INSTRU U-SHP 10X18 (DRAPES) ×3 IMPLANT
DRSG OPSITE POSTOP 3X4 (GAUZE/BANDAGES/DRESSINGS) ×2 IMPLANT
DRSG TEGADERM 4X4.75 (GAUZE/BANDAGES/DRESSINGS) ×12 IMPLANT
DURAPREP 6ML APPLICATOR 50/CS (WOUND CARE) ×3 IMPLANT
ELECT COATED BLADE 2.86 ST (ELECTRODE) ×3 IMPLANT
ELECT REM PT RETURN 9FT ADLT (ELECTROSURGICAL) ×3
ELECTRODE REM PT RTRN 9FT ADLT (ELECTROSURGICAL) ×1 IMPLANT
GAUZE SPONGE 4X4 16PLY XRAY LF (GAUZE/BANDAGES/DRESSINGS) IMPLANT
GENERATOR MODEL 106 ASPIRE (Neuro Prosthesis/Implant) ×2 IMPLANT
GLOVE BIOGEL PI IND STRL 7.5 (GLOVE) ×1 IMPLANT
GLOVE BIOGEL PI INDICATOR 7.5 (GLOVE) ×2
GLOVE ECLIPSE 7.0 STRL STRAW (GLOVE) ×3 IMPLANT
GOWN STRL REUS W/ TWL LRG LVL3 (GOWN DISPOSABLE) ×2 IMPLANT
GOWN STRL REUS W/ TWL XL LVL3 (GOWN DISPOSABLE) IMPLANT
GOWN STRL REUS W/TWL 2XL LVL3 (GOWN DISPOSABLE) IMPLANT
GOWN STRL REUS W/TWL LRG LVL3 (GOWN DISPOSABLE) ×6
GOWN STRL REUS W/TWL XL LVL3 (GOWN DISPOSABLE)
HEMOSTAT POWDER KIT SURGIFOAM (HEMOSTASIS) ×1 IMPLANT
KIT BASIN OR (CUSTOM PROCEDURE TRAY) ×3 IMPLANT
KIT ROOM TURNOVER OR (KITS) ×3 IMPLANT
LOOP VESSEL MAXI BLUE (MISCELLANEOUS) IMPLANT
LOOP VESSEL MINI RED (MISCELLANEOUS) IMPLANT
NDL HYPO 25X1 1.5 SAFETY (NEEDLE) ×1 IMPLANT
NEEDLE HYPO 25X1 1.5 SAFETY (NEEDLE) ×3 IMPLANT
NS IRRIG 1000ML POUR BTL (IV SOLUTION) ×3 IMPLANT
OIL CARTRIDGE MAESTRO DRILL (MISCELLANEOUS)
PACK LAMINECTOMY NEURO (CUSTOM PROCEDURE TRAY) ×3 IMPLANT
PAD ARMBOARD 7.5X6 YLW CONV (MISCELLANEOUS) ×9 IMPLANT
SPONGE INTESTINAL PEANUT (DISPOSABLE) IMPLANT
SPONGE SURGIFOAM ABS GEL SZ50 (HEMOSTASIS) ×1 IMPLANT
SUT ETHILON 3 0 FSL (SUTURE) IMPLANT
SUT NURALON 4 0 TR CR/8 (SUTURE) ×1 IMPLANT
SUT VIC AB 3-0 SH 8-18 (SUTURE) ×3 IMPLANT
SUT VICRYL 3-0 RB1 18 ABS (SUTURE) ×6 IMPLANT
TOWEL OR 17X24 6PK STRL BLUE (TOWEL DISPOSABLE) ×3 IMPLANT
TOWEL OR 17X26 10 PK STRL BLUE (TOWEL DISPOSABLE) ×3 IMPLANT
WATER STERILE IRR 1000ML POUR (IV SOLUTION) ×3 IMPLANT

## 2016-03-13 NOTE — Telephone Encounter (Signed)
Would probably wait a week for the surgical site to heal some, then of will reprogram the vagal nerve stimulator.

## 2016-03-13 NOTE — H&P (Signed)
CC:  Epilepsy, battery is dead  HPI: Eric Fernandez is a 41 year old man with a history of medically intractable epilepsy, followed by Dr. Jannifer Franklin. He has previously undergone placement of a left vagal nerve stimulator back in 2010 at [facility]. Recent interrogation and Dr. Tobey Grim office has revealed near end-of-life of the battery. He was therefore referred for battery change.   PMH: Past Medical History:  Diagnosis Date  . Anxiety   . Depression   . Diabetes mellitus without complication ([facility])   . Hypertension   . Mental disorder    border line bipolar  . Mild obesity   . Restless legs syndrome (RLS) 11/06/2012   Right leg  . Seizures ([facility])    Intractible  . Sleep apnea   . Suicide and self-inflicted injury ([facility] [place])   . Vertigo     PSH: Past Surgical History:  Procedure Laterality Date  . BURN DEBRIDEMENT SURGERY    . DIRECT LARYNGOSCOPY WITH RADIAESSE INJECTION  04/11/2011   Procedure: DIRECT LARYNGOSCOPY WITH RADIAESSE INJECTION;  Surgeon: Melida Quitter, MD;  Location: [facility];  Service: ENT;  Laterality: N/A;  MICRO DIRECT LARYNGOSCOPY WITH LEFT VOCAL FOLD RADIESSE INJECTION/JET VENTURI VENTILATION  . JOINT REPLACEMENT     rotator cuff surgery   lt  . SHOULDER SURGERY N/A   . VAGUS NERVE STIMULATOR INSERTION      SH: Social History  Substance Use Topics  . Smoking status: Former Research scientist (life sciences)  . Smokeless tobacco: Never Used     Comment: Quit when he was 41 years old  . Alcohol use No    MEDS: Prior to Admission medications   Medication Sig Start Date End Date Taking? Authorizing Provider  amLODipine (NORVASC) 5 MG tablet TAKE ONE TABLET BY MOUTH DAILY 04/12/14  Yes Kathrynn Ducking, MD  DILANTIN 100 MG ER capsule TAKE 3 CAPSULES (300MG  TOTAL) TWICE DAILY 02/16/16  Yes Kathrynn Ducking, MD  escitalopram (LEXAPRO) 20 MG tablet Take 1 tablet (20 mg total) by mouth daily. 02/16/16  Yes Kathrynn Ducking, MD  FARXIGA 10 MG TABS tablet Take 10 mg by mouth daily.  09/08/15  Yes Historical  Provider, MD  ibuprofen (ADVIL,MOTRIN) 800 MG tablet Take 800 mg by mouth every 8 (eight) hours as needed (for pain).   Yes Historical Provider, MD  JANUVIA 100 MG tablet Take 100 mg by mouth daily.  08/26/15  Yes Historical Provider, MD  Lacosamide (VIMPAT) 150 MG TABS Take 1 tablet (150 mg total) by mouth 2 (two) times daily. 01/23/16  Yes Kathrynn Ducking, MD  lamoTRIgine (LAMICTAL) 200 MG tablet Take 1 tablet (200 mg total) by mouth 2 (two) times daily. Brand is medically necessary 04/05/15  Yes Kathrynn Ducking, MD  lamoTRIgine (LAMICTAL) 25 MG tablet Take 2 tablets (50 mg total) by mouth 2 (two) times daily. 01/23/16  Yes Kathrynn Ducking, MD  lisinopril (PRINIVIL,ZESTRIL) 10 MG tablet TAKE 1 TABLET EVERY DAY 11/10/15  Yes Kathrynn Ducking, MD  metFORMIN (GLUCOPHAGE) 500 MG tablet Take 1,000 mg by mouth 2 (two) times daily with a meal.  11/23/13  Yes Historical Provider, MD  phenytoin (DILANTIN) 50 MG tablet CHEW AND SWALLOW 1 TABLET EVERY DAY 07/15/15  Yes Kathrynn Ducking, MD  pramipexole (MIRAPEX) 1 MG tablet Take 1 tablet (1 mg total) by mouth at bedtime. 01/31/16  Yes Kathrynn Ducking, MD  pravastatin (PRAVACHOL) 40 MG tablet Take 40 mg by mouth at bedtime.    Yes Historical Provider, MD  zolpidem (  AMBIEN) 10 MG tablet TAKE 1 TABLET AT BEDTIME AS NEEDED FOR SLEEP 10/10/15  Yes Kathrynn Ducking, MD  cholecalciferol (VITAMIN D) 1000 units tablet TAKE 1 TABLET TWICE DAILY 03/07/16   Kathrynn Ducking, MD  HYDROcodone-acetaminophen (NORCO/VICODIN) 5-325 MG tablet Take 1 tablet by mouth every 6 (six) hours as needed for moderate pain. 03/05/16   Kathrynn Ducking, MD    ALLERGY: Allergies  Allergen Reactions  . No Known Allergies     ROS: ROS  NEUROLOGIC EXAM: Awake, alert, oriented Memory and concentration grossly intact Speech fluent, appropriate CN grossly intact Motor exam: Upper Extremities Deltoid Bicep Tricep Grip  Right 5/5 5/5 5/5 5/5  Left 5/5 5/5 5/5 5/5   Lower Extremity IP  Quad PF DF EHL  Right 5/5 5/5 5/5 5/5 5/5  Left 5/5 5/5 5/5 5/5 5/5   Sensation grossly intact to LT  IMPRESSION: - 41 y.o. male with history of previously implanted VNS for epilepsy with battery nearing end-of-life.  PLAN: - Will replace VNS battery  I did review with the patient the details of the surgery, and expected recovery. Risks of the surgery were reviewed including risk of damage to the vagal nerve stimulator, bleeding, infection. Patient understood our discussion and is willing to proceed. All questions were answered.

## 2016-03-13 NOTE — Anesthesia Procedure Notes (Signed)
Procedure Name: Intubation Date/Time: 03/13/2016 1:11 PM Performed by: Gaylene Brooks Pre-anesthesia Checklist: Patient identified, Emergency Drugs available, Suction available and Patient being monitored Patient Re-evaluated:Patient Re-evaluated prior to inductionOxygen Delivery Method: Circle System Utilized Preoxygenation: Pre-oxygenation with 100% oxygen Intubation Type: IV induction, Rapid sequence and Cricoid Pressure applied Laryngoscope Size: Miller and 2 Grade View: Grade I Tube type: Oral Tube size: 7.5 mm Number of attempts: 1 Airway Equipment and Method: Stylet and Oral airway Placement Confirmation: ETT inserted through vocal cords under direct vision,  positive ETCO2 and breath sounds checked- equal and bilateral Secured at: 23 cm Tube secured with: Tape Dental Injury: Teeth and Oropharynx as per pre-operative assessment

## 2016-03-13 NOTE — Progress Notes (Signed)
Patient ate about 7 gummy bears this morning at 0600.  Will notify anesthesiologist of this.  Patient also stated that he has 6 cameras at his home to where his mom can watch him throughout the day but patient will not have someone one in the home with him for 24 hours after surgery.

## 2016-03-13 NOTE — Transfer of Care (Signed)
Immediate Anesthesia Transfer of Care Note  Patient: Jailen S Shaver  Procedure(s) Performed: Procedure(s) with comments: REPLACEMENT OF VAGAL NERVE STIMULATOR BATTERY (Left) - REPLACEMENT OF VAGAL NERVE STIMULATOR BATTERY  Patient Location: PACU  Anesthesia Type:General  Level of Consciousness: awake, alert  and oriented  Airway & Oxygen Therapy: Patient Spontanous Breathing and Patient connected to face mask oxygen  Post-op Assessment: Report given to RN, Post -op Vital signs reviewed and stable and Patient moving all extremities X 4  Post vital signs: Reviewed and stable  Last Vitals:  Vitals:   03/13/16 0814  BP: (!) 152/85  Pulse: 80  Resp: 18  Temp: 36.7 C    Last Pain:  Vitals:   03/13/16 0814  TempSrc: Oral         Complications: No apparent anesthesia complications

## 2016-03-13 NOTE — Op Note (Signed)
PREOP DIAGNOSIS:  1. Medically intractable epilepsy  2. Battery end-of-life  POSTOP DIAGNOSIS: Same  PROCEDURE: 1. Replacement of VNS battery  SURGEON: Dr. Consuella Lose, MD  ASSISTANT: None  ANESTHESIA: General Endotracheal  EBL: 50cc  SPECIMENS: None  DRAINS: None  COMPLICATIONS: None immediate  CONDITION: Hemodynamically stable to PACU  HISTORY: Eric Fernandez is a 41 y.o. male who was initially seen in the outpatient clinic as a referral from neurology for replacement of his VNS battery. He previously underwent implantation in 2012, and recent interrogation revealed near end-of-life. Risks and benefits were reviewed, and consent was obtained.  PROCEDURE IN DETAIL: After informed consent was obtained and witnessed, the patient was brought to the operating room. After induction of general anesthesia, the patient was positioned on the operative table in the supine position. All pressure points were meticulously padded. Skin incisions were then marked out and prepped and draped in the usual sterile fashion.  After timeout was conducted, the previous infraclavicular incision was infiltrated with local and opened sharply. bovie was used to identify the pocket containing the dead battery. This was explanted and the VNS lead removed. New battery was connected and re-implanted in the chest pocket. The new system was then interrogated and reprogrammed to the previous settings.  Wound was then irrigated with antibiotic saline. Subcutaneous layer was closed using interrupted 3-0 Vicryl stitches. Skin was then closed using interrupted 3-0 Vicryl, and a layer of Dermabond was applied.   At the end of the case all sponge, needle, cottonoid, and instrument counts were correct. The patient was then extubated, transferred to the stretcher, and taken to the postanesthesia care unit in stable hemodynamic condition.

## 2016-03-13 NOTE — Anesthesia Preprocedure Evaluation (Addendum)
Anesthesia Evaluation  Patient identified by MRN, date of birth, ID band Patient awake    Reviewed: Allergy & Precautions, NPO status , Patient's Chart, lab work & pertinent test results  Airway Mallampati: II  TM Distance: >3 FB Neck ROM: Full    Dental  (+) Teeth Intact, Dental Advisory Given   Pulmonary sleep apnea , former smoker,    breath sounds clear to auscultation       Cardiovascular hypertension, Pt. on medications  Rhythm:Regular Rate:Normal     Neuro/Psych Seizures -,  PSYCHIATRIC DISORDERS Anxiety Depression    GI/Hepatic negative GI ROS, Neg liver ROS,   Endo/Other  diabetes, Type 2, Oral Hypoglycemic Agents  Renal/GU negative Renal ROS  negative genitourinary   Musculoskeletal negative musculoskeletal ROS (+)   Abdominal   Peds negative pediatric ROS (+)  Hematology negative hematology ROS (+)   Anesthesia Other Findings   Reproductive/Obstetrics negative OB ROS                           Lab Results  Component Value Date   WBC 7.8 03/08/2016   HGB 15.7 03/08/2016   HCT 45.6 03/08/2016   MCV 89.4 03/08/2016   PLT 247 03/08/2016   Lab Results  Component Value Date   CREATININE 0.73 03/08/2016   BUN 12 03/08/2016   NA 138 03/08/2016   K 4.6 03/08/2016   CL 105 03/08/2016   CO2 22 03/08/2016   No results found for: INR, PROTIME  10/2015 EKG: sinus tachycardia.  Anesthesia Physical Anesthesia Plan  ASA: II  Anesthesia Plan: General   Post-op Pain Management:    Induction: Intravenous  Airway Management Planned: Oral ETT  Additional Equipment:   Intra-op Plan:   Post-operative Plan: Extubation in OR  Informed Consent: I have reviewed the patients History and Physical, chart, labs and discussed the procedure including the risks, benefits and alternatives for the proposed anesthesia with the patient or authorized representative who has indicated his/her  understanding and acceptance.   Dental advisory given  Plan Discussed with: CRNA  Anesthesia Plan Comments:         Anesthesia Quick Evaluation

## 2016-03-13 NOTE — Telephone Encounter (Signed)
Pt called said he had a new NVS put in today, the last one the battery died he said. He is wanting an appt with Dr Viona Gilmore to check it out to make sure everything is working properly. He can also be reached friend's house Anastasia Pall [phone] if unsuccessful to reach him on his cell phone

## 2016-03-13 NOTE — Anesthesia Postprocedure Evaluation (Addendum)
Anesthesia Post Note  Patient: Eric Fernandez  Procedure(s) Performed: Procedure(s) (LRB): REPLACEMENT OF VAGAL NERVE STIMULATOR BATTERY (Left)  Patient location during evaluation: PACU Anesthesia Type: General Level of consciousness: awake and alert Pain management: pain level controlled Vital Signs Assessment: post-procedure vital signs reviewed and stable Respiratory status: spontaneous breathing, nonlabored ventilation, respiratory function stable and patient connected to nasal cannula oxygen Cardiovascular status: blood pressure returned to baseline and stable Postop Assessment: no signs of nausea or vomiting Anesthetic complications: no       Last Vitals:  Vitals:   03/13/16 1445 03/13/16 1450  BP:  139/76  Pulse: (!) 103 98  Resp: (!) 22 (!) 26  Temp:  37.2 C    Last Pain:  Vitals:   03/13/16 1445  TempSrc:   PainSc: Asleep                 Effie Berkshire

## 2016-03-14 ENCOUNTER — Encounter (HOSPITAL_COMMUNITY): Payer: Self-pay | Admitting: Neurosurgery

## 2016-03-14 ENCOUNTER — Telehealth: Payer: Self-pay | Admitting: Neurology

## 2016-03-14 NOTE — Telephone Encounter (Signed)
Called pt to notify him of scheduled appt. Questions answered about surgical wound care. Verbalized understanding and appreciation for call. Pt would like a call back next week for follow-up. Agreed to call back before then w/ any additional questions/concerns.

## 2016-03-14 NOTE — Telephone Encounter (Signed)
Patient requesting apt regarding VNS. Best call back is [phone]

## 2016-03-14 NOTE — Telephone Encounter (Signed)
Returned TC to [employer], VNS rep. Appt was scheduled for pt on 04/12/16 for reprogramming.

## 2016-03-15 ENCOUNTER — Ambulatory Visit: Payer: Commercial Managed Care - HMO | Admitting: Adult Health

## 2016-03-21 ENCOUNTER — Telehealth: Payer: Self-pay

## 2016-03-21 ENCOUNTER — Encounter: Payer: Self-pay | Admitting: Neurology

## 2016-03-21 ENCOUNTER — Ambulatory Visit (INDEPENDENT_AMBULATORY_CARE_PROVIDER_SITE_OTHER): Payer: Medicare HMO | Admitting: Neurology

## 2016-03-21 VITALS — BP 134/75 | HR 102 | Ht 68.0 in

## 2016-03-21 DIAGNOSIS — G40119 Localization-related (focal) (partial) symptomatic epilepsy and epileptic syndromes with simple partial seizures, intractable, without status epilepticus: Secondary | ICD-10-CM | POA: Diagnosis not present

## 2016-03-21 DIAGNOSIS — R569 Unspecified convulsions: Secondary | ICD-10-CM | POA: Diagnosis not present

## 2016-03-21 DIAGNOSIS — G40319 Generalized idiopathic epilepsy and epileptic syndromes, intractable, without status epilepticus: Secondary | ICD-10-CM

## 2016-03-21 MED ORDER — LAMOTRIGINE 100 MG PO TABS: 300.0000 mg | ORAL_TABLET | Freq: Two times a day (BID) | ORAL | 3 refills | Status: DC

## 2016-03-21 NOTE — Telephone Encounter (Signed)
Left message for pt to call to see how he is doing since surgery to replace VNS. Gave [facility] phone number

## 2016-03-21 NOTE — Telephone Encounter (Signed)
Patient states he was at his neighbors house yesterday around 4pm and he had a seizure. He fell and hit his head. Per patient, he has a bump on right side of head. No other pain. He reports being nervous since episode. BP in office 134/75, HR 102 at 1039. He is alone. He is concerned VNS might be off.

## 2016-03-21 NOTE — Telephone Encounter (Signed)
Received message from check in: "Eric Fernandez 06/02/1975, a patient of Dr. Jannifer Franklin, is here.  Said he had a seizure yesterday about 4PM. Has a VNS and wonders if it is off.  Wants to see if he will check it to see if on and working?  Can you check with Dr. Jannifer Franklin"  Per Dr Jannifer Franklin, he will see patient while he is here.

## 2016-03-21 NOTE — Telephone Encounter (Signed)
-----   Message from Monte Fantasia, RN sent at 03/14/2016 10:40 AM EST ----- F/u on VNS

## 2016-03-21 NOTE — Procedures (Signed)
     History: Eric Fernandez is a 41 year old gentleman with intractable epilepsy and bipolar disorder. He has had increased generalized seizures, last seizure was yesterday. He is on Dilantin, Lamictal, and Vimpat. He is being reevaluated for the seizures.   VAGAL NERVE STIMULATOR SETTINGS: Output current: 150 milliamps  Signal frequency: 30 Hz  Pulse width: 500 microseconds   On time: 30 seconds  Off time: 1.1 minutes  Magnet current: 2.0 milliamps  Magnet on time: 60 seconds  Pulse width: 250 microseconds  Activations: 109  Output current: 1.5 milliamps  Cardiac monitor stimulation: Turned on to 0.25 millivolts   Pulse width: 500 ms   On time: 60 seconds   Lead impedance: 2704 Ohms  Activations: 11  IFI: No  The VNS unit was interrogated, and the settings were changed as above. The patient appeared to tolerate the changes well.  Jill Alexanders MD 03/16/2015 8:57 AM  [facility] [address] [place], [place] [address]  Phone [phone] Fax [phone]

## 2016-03-21 NOTE — Progress Notes (Signed)
Reason for visit: Seizures  Eric Fernandez is an 41 y.o. male  History of present illness:  Eric Fernandez is a 41 year old right-handed white male with a history of intractable seizures. The patient comes in the office today for an urgent evaluation. The patient had a seizure yesterday. He recently had a vagal nerve stimulator replacement as the old unit stopped working as the battery failed. The patient has been having many more seizures than usual. The patient has not missed any doses. The patient is having 1 generalized seizure on average once every 2 weeks. The patient returns as he is concerned about the functioning of his vagal nerve stimulator. He cannot feel the vagal nerve stimulator cycle.  Past Medical History:  Diagnosis Date  . Anxiety   . Depression   . Diabetes mellitus without complication ([place])   . Hypertension   . Mental disorder    border line bipolar  . Mild obesity   . Restless legs syndrome (RLS) 11/06/2012   Right leg  . Seizures (HCC)    Intractible  . Sleep apnea   . Suicide and self-inflicted injury ([place])   . Vertigo     Past Surgical History:  Procedure Laterality Date  . BURN DEBRIDEMENT SURGERY    . DIRECT LARYNGOSCOPY WITH RADIAESSE INJECTION  04/11/2011   Procedure: DIRECT LARYNGOSCOPY WITH RADIAESSE INJECTION;  Surgeon: Melida Quitter, MD;  Location: [place];  Service: ENT;  Laterality: N/A;  MICRO DIRECT LARYNGOSCOPY WITH LEFT VOCAL FOLD RADIESSE INJECTION/JET VENTURI VENTILATION  . JOINT REPLACEMENT     rotator cuff surgery   lt  . SHOULDER SURGERY N/A   . VAGUS NERVE STIMULATOR INSERTION    . VAGUS NERVE STIMULATOR INSERTION Left 03/13/2016   Procedure: REPLACEMENT OF VAGAL NERVE STIMULATOR BATTERY;  Surgeon: Consuella Lose, MD;  Location: [place];  Service: Neurosurgery;  Laterality: Left;  REPLACEMENT OF VAGAL NERVE STIMULATOR BATTERY    Family History  Problem Relation Age of Onset  . Diabetes Mother   . ALS Maternal Aunt   . Cancer  Maternal Uncle     Social history:  reports that he has quit smoking. He has never used smokeless tobacco. He reports that he does not drink alcohol or use drugs.    Allergies  Allergen Reactions  . No Known Allergies     Medications:  Prior to Admission medications   Medication Sig Start Date End Date Taking? Authorizing Provider  amLODipine (NORVASC) 5 MG tablet TAKE ONE TABLET BY MOUTH DAILY 04/12/14   Kathrynn Ducking, MD  cholecalciferol (VITAMIN D) 1000 units tablet TAKE 1 TABLET TWICE DAILY 03/07/16   Kathrynn Ducking, MD  DILANTIN 100 MG ER capsule TAKE 3 CAPSULES (300MG  TOTAL) TWICE DAILY 02/16/16   Kathrynn Ducking, MD  escitalopram (LEXAPRO) 20 MG tablet Take 1 tablet (20 mg total) by mouth daily. 02/16/16   Kathrynn Ducking, MD  FARXIGA 10 MG TABS tablet Take 10 mg by mouth daily.  09/08/15   Historical Provider, MD  HYDROcodone-acetaminophen (NORCO/VICODIN) 5-325 MG tablet Take 1 tablet by mouth every 6 (six) hours as needed for moderate pain. 03/05/16   Kathrynn Ducking, MD  ibuprofen (ADVIL,MOTRIN) 800 MG tablet Take 800 mg by mouth every 8 (eight) hours as needed (for pain).    Historical Provider, MD  JANUVIA 100 MG tablet Take 100 mg by mouth daily.  08/26/15   Historical Provider, MD  Lacosamide (VIMPAT) 150 MG TABS Take 1 tablet (150 mg  total) by mouth 2 (two) times daily. 01/23/16   Kathrynn Ducking, MD  lamoTRIgine (LAMICTAL) 100 MG tablet Take 3 tablets (300 mg total) by mouth 2 (two) times daily. 03/21/16   Kathrynn Ducking, MD  lisinopril (PRINIVIL,ZESTRIL) 10 MG tablet TAKE 1 TABLET EVERY DAY 11/10/15   Kathrynn Ducking, MD  metFORMIN (GLUCOPHAGE) 500 MG tablet Take 1,000 mg by mouth 2 (two) times daily with a meal.  11/23/13   Historical Provider, MD  phenytoin (DILANTIN) 50 MG tablet CHEW AND SWALLOW 1 TABLET EVERY DAY 07/15/15   Kathrynn Ducking, MD  pramipexole (MIRAPEX) 1 MG tablet Take 1 tablet (1 mg total) by mouth at bedtime. 01/31/16   Kathrynn Ducking, MD    pravastatin (PRAVACHOL) 40 MG tablet Take 40 mg by mouth at bedtime.     Historical Provider, MD  zolpidem (AMBIEN) 10 MG tablet TAKE 1 TABLET AT BEDTIME AS NEEDED FOR SLEEP 10/10/15   Kathrynn Ducking, MD    ROS:  Out of a complete 14 system review of symptoms, the patient complains only of the following symptoms, and all other reviewed systems are negative.  Seizures  Blood pressure 134/75, pulse (!) 102, height 5\' 8"  (1.727 m).  Physical Exam  General: The patient is alert and cooperative at the time of the examination.  Skin: No significant peripheral edema is noted.   Neurologic Exam  Mental status: The patient is alert and oriented x 3 at the time of the examination. The patient has apparent normal recent and remote memory, with an apparently normal attention span and concentration ability.   Cranial nerves: Facial symmetry is present. Speech is normal, no aphasia or dysarthria is noted. Extraocular movements are full. Visual fields are full.  Motor: The patient has good strength in all 4 extremities.  Sensory examination: Soft touch sensation is symmetric on the face, arms, and legs.  Coordination: The patient has good finger-nose-finger and heel-to-shin bilaterally.  Gait and station: The patient has a normal gait. Tandem gait is normal. Romberg is negative. No drift is seen.  Reflexes: Deep tendon reflexes are symmetric.   Assessment/Plan:  1. History of seizures with recent recurrence  2. Vagal nerve stimulator placement  The vagal nerve stimulator appears to be working appropriately. The patient had the stimulator reprogrammed today. He will follow-up in 2 months, we will go up on the lamotrigine dosing gradually getting him to 300 mg twice daily. The patient will have blood work on the next visit.  Eric Alexanders MD 03/21/2016 11:19 AM  [facility] [address] [place], [place] [address]  Phone [phone] Fax  [phone]

## 2016-03-21 NOTE — Telephone Encounter (Signed)
The patient was seen today for a revisit and for vagal nerve stimulator assessment. Please refer to the note.

## 2016-03-21 NOTE — Patient Instructions (Signed)
With the lamictal take 250 mg in the morning and 300 mg in the evening for 2 weeks, then start 300 mg three times a day.  We will start taking the 100 mg tablets of lamictal 3 tablets three times a day.

## 2016-03-29 ENCOUNTER — Emergency Department (HOSPITAL_COMMUNITY)
Admission: EM | Admit: 2016-03-29 | Discharge: 2016-03-29 | Disposition: A | Discharge status: Home / Self Care | Payer: Medicare HMO | Attending: Emergency Medicine | Admitting: Emergency Medicine

## 2016-03-29 ENCOUNTER — Telehealth: Payer: Self-pay | Admitting: Neurology

## 2016-03-29 ENCOUNTER — Other Ambulatory Visit: Payer: Self-pay | Admitting: Neurology

## 2016-03-29 DIAGNOSIS — R569 Unspecified convulsions: Secondary | ICD-10-CM | POA: Insufficient documentation

## 2016-03-29 DIAGNOSIS — Z5321 Procedure and treatment not carried out due to patient leaving prior to being seen by health care provider: Secondary | ICD-10-CM | POA: Diagnosis not present

## 2016-03-29 NOTE — ED Provider Notes (Signed)
Pt arrived by EMS after a seizure.  He has a history of seizures.  I spoke to the patient at the bedside. Pt states he does not want to be evaluated in the ED.  He feels fine and wants to go home.  Pt is aware of the date, time, location and president.  He does not appear to be confused.  He appears to be capable of making and informed decision.  Pt left without having an evaluation.   Dorie Rank, MD 03/29/16 1728

## 2016-03-29 NOTE — Telephone Encounter (Signed)
Patient's mother, May Harrison called the after hours phone line at 6:43, I called back at 6:46 PM and talked to her about 10 min: She reports, that since he had a VNS battery change some 2 weeks ago, he has had more prolonged Sz, most recent today, a neighbor called EMS and they took him to [facility] ER, but he "signed himself out". She said, he took an additional dose of his Sz med, but did not say which one. Went on to voice frustration regarding his care and how she does not know what to do (repeated this many times) and how no one is listening to her or helping them. She lives or works in [employer] and [employer] with him, neglecting her husband who is "not his father". I made some suggestions, such as asking someone to stay with him, or how she could call 911, if she was concerned for his safety, but she was not very receptive, became louder and more agitated, unfortunately, I could not get many words in. I informed her that I would let Dr. Jannifer Franklin know, maybe he could be seen sooner for an appointment. She hung up on me.  I reviewed the brief ER note: patient left without having an evaluation.  I am not sure, if he signed [facility] paperwork.  Reviewed office note by Dr. Jannifer Franklin from last week, 03/21/16: patient was seen for an urgent evaluation. VNS at the time was apparently working appropriately, his lamictal was to be increased gradually.

## 2016-03-29 NOTE — ED Triage Notes (Signed)
Per EMS: Pt had a seizure today and has a hx of seizures. Upon entering the room pt made it known that he does not wish to be seen today.  MD came in the room to discuss this w/ pt. Pt still wishes to leave w/o being seen today.  Vitals WDL, AxOx4

## 2016-03-29 NOTE — ED Triage Notes (Signed)
Pt's Neuro is WDL

## 2016-04-03 ENCOUNTER — Telehealth: Payer: Self-pay | Admitting: Neurology

## 2016-04-03 DIAGNOSIS — G4733 Obstructive sleep apnea (adult) (pediatric): Secondary | ICD-10-CM | POA: Diagnosis not present

## 2016-04-03 NOTE — Telephone Encounter (Signed)
Dr Jannifer Franklin- please advise. This pt you fit in on 03/21/16 when he came to office wanting VNS checked.  You have an opening tomorrow at 330pm I placed on hold if you would like to offer this.

## 2016-04-03 NOTE — Telephone Encounter (Signed)
Patients mother called office requesting patient to be seen sooner than April with Dr. Jannifer Franklin.  Per mother patient has a grandma seizure last week and stayed in longer than normal, also a neighbor that has been keeping an eye out for him states he has been having mini seizures.  Patients mother is worried and concerned about his VNS that was place a few weeks ago.  Please call patient directly at [phone] (per mother) if we are able to see him sooner than April.

## 2016-04-03 NOTE — Telephone Encounter (Signed)
I called patient. The patient is still having seizures with some regularity. We have already had the patient on a ramping schedule to increase the Lamictal dose. He should go up again on the dose within the next 2 days. I do not think that seen back in the office tomorrow we'll change the original plan. He has already had a recent vagal nerve stimulator adjustment. We will need to see him back several weeks after the Lamictal dose adjustment to check levels.

## 2016-04-11 ENCOUNTER — Emergency Department (HOSPITAL_COMMUNITY)
Admission: EM | Admit: 2016-04-11 | Discharge: 2016-04-11 | Disposition: A | Discharge status: Home / Self Care | Payer: Medicare HMO | Attending: Emergency Medicine | Admitting: Emergency Medicine

## 2016-04-11 ENCOUNTER — Ambulatory Visit (HOSPITAL_COMMUNITY)
Admission: EM | Admit: 2016-04-11 | Discharge: 2016-04-11 | Disposition: A | Discharge status: Nursing Home (Includes ICF) | Payer: Medicare HMO | Attending: Family Medicine | Admitting: Family Medicine

## 2016-04-11 ENCOUNTER — Encounter (HOSPITAL_COMMUNITY): Payer: Self-pay | Admitting: *Deleted

## 2016-04-11 ENCOUNTER — Emergency Department (HOSPITAL_COMMUNITY): Payer: Medicare HMO

## 2016-04-11 DIAGNOSIS — Z7984 Long term (current) use of oral hypoglycemic drugs: Secondary | ICD-10-CM | POA: Insufficient documentation

## 2016-04-11 DIAGNOSIS — Z79899 Other long term (current) drug therapy: Secondary | ICD-10-CM | POA: Insufficient documentation

## 2016-04-11 DIAGNOSIS — G43A0 Cyclical vomiting, not intractable: Secondary | ICD-10-CM | POA: Diagnosis not present

## 2016-04-11 DIAGNOSIS — Z96612 Presence of left artificial shoulder joint: Secondary | ICD-10-CM | POA: Insufficient documentation

## 2016-04-11 DIAGNOSIS — G40909 Epilepsy, unspecified, not intractable, without status epilepticus: Secondary | ICD-10-CM | POA: Diagnosis not present

## 2016-04-11 DIAGNOSIS — R509 Fever, unspecified: Secondary | ICD-10-CM | POA: Diagnosis not present

## 2016-04-11 DIAGNOSIS — R079 Chest pain, unspecified: Secondary | ICD-10-CM | POA: Diagnosis not present

## 2016-04-11 DIAGNOSIS — Z87891 Personal history of nicotine dependence: Secondary | ICD-10-CM | POA: Diagnosis not present

## 2016-04-11 DIAGNOSIS — R1115 Cyclical vomiting syndrome unrelated to migraine: Secondary | ICD-10-CM

## 2016-04-11 DIAGNOSIS — I10 Essential (primary) hypertension: Secondary | ICD-10-CM | POA: Diagnosis not present

## 2016-04-11 DIAGNOSIS — R1110 Vomiting, unspecified: Secondary | ICD-10-CM | POA: Diagnosis present

## 2016-04-11 DIAGNOSIS — E119 Type 2 diabetes mellitus without complications: Secondary | ICD-10-CM | POA: Insufficient documentation

## 2016-04-11 DIAGNOSIS — R112 Nausea with vomiting, unspecified: Secondary | ICD-10-CM | POA: Insufficient documentation

## 2016-04-11 LAB — CBC
HEMATOCRIT: 43.3 % (ref 39.0–52.0)
Hemoglobin: 15 g/dL (ref 13.0–17.0)
MCH: 30.3 pg (ref 26.0–34.0)
MCHC: 34.6 g/dL (ref 30.0–36.0)
MCV: 87.5 fL (ref 78.0–100.0)
PLATELETS: 240 10*3/uL (ref 150–400)
RBC: 4.95 MIL/uL (ref 4.22–5.81)
RDW: 13.4 % (ref 11.5–15.5)
WBC: 13.6 10*3/uL — AB (ref 4.0–10.5)

## 2016-04-11 LAB — COMPREHENSIVE METABOLIC PANEL
ALT: 18 U/L (ref 17–63)
AST: 19 U/L (ref 15–41)
Albumin: 3.8 g/dL (ref 3.5–5.0)
Alkaline Phosphatase: 151 U/L — ABNORMAL HIGH (ref 38–126)
Anion gap: 13 (ref 5–15)
BILIRUBIN TOTAL: 1 mg/dL (ref 0.3–1.2)
BUN: 17 mg/dL (ref 6–20)
CHLORIDE: 98 mmol/L — AB (ref 101–111)
CO2: 19 mmol/L — ABNORMAL LOW (ref 22–32)
Calcium: 9.2 mg/dL (ref 8.9–10.3)
Creatinine, Ser: 0.98 mg/dL (ref 0.61–1.24)
Glucose, Bld: 175 mg/dL — ABNORMAL HIGH (ref 65–99)
Potassium: 3.7 mmol/L (ref 3.5–5.1)
Sodium: 130 mmol/L — ABNORMAL LOW (ref 135–145)
TOTAL PROTEIN: 6.7 g/dL (ref 6.5–8.1)

## 2016-04-11 LAB — LIPASE, BLOOD: Lipase: 23 U/L (ref 11–51)

## 2016-04-11 MED ORDER — PHENYTOIN 50 MG PO CHEW: 50.0000 mg | CHEWABLE_TABLET | Freq: Once | ORAL | Status: DC

## 2016-04-11 MED ORDER — ACETAMINOPHEN 325 MG PO TABS
650.0000 mg | ORAL_TABLET | Freq: Once | ORAL | Status: AC
  Administered 2016-04-11: 650 mg via ORAL

## 2016-04-11 MED ORDER — ONDANSETRON HCL 4 MG/2ML IJ SOLN
4.0000 mg | Freq: Once | INTRAMUSCULAR | Status: AC
  Administered 2016-04-11: 4 mg via INTRAVENOUS
  Filled 2016-04-11: qty 2

## 2016-04-11 MED ORDER — PHENYTOIN SODIUM EXTENDED 100 MG PO CAPS
300.0000 mg | ORAL_CAPSULE | Freq: Once | ORAL | Status: AC
  Administered 2016-04-11: 300 mg via ORAL
  Filled 2016-04-11: qty 3

## 2016-04-11 MED ORDER — ONDANSETRON 4 MG PO TBDP
ORAL_TABLET | ORAL | Status: AC
  Filled 2016-04-11: qty 1

## 2016-04-11 MED ORDER — ONDANSETRON 4 MG PO TBDP
4.0000 mg | ORAL_TABLET | Freq: Once | ORAL | Status: AC | PRN
  Administered 2016-04-11: 4 mg via ORAL

## 2016-04-11 MED ORDER — LACOSAMIDE 50 MG PO TABS
150.0000 mg | ORAL_TABLET | Freq: Once | ORAL | Status: AC
  Administered 2016-04-11: 150 mg via ORAL

## 2016-04-11 MED ORDER — ACETAMINOPHEN 325 MG PO TABS
ORAL_TABLET | ORAL | Status: AC
  Filled 2016-04-11: qty 2

## 2016-04-11 MED ORDER — LAMOTRIGINE 150 MG PO TABS
300.0000 mg | ORAL_TABLET | Freq: Once | ORAL | Status: AC
  Administered 2016-04-11: 300 mg via ORAL
  Filled 2016-04-11: qty 2

## 2016-04-11 MED ORDER — SODIUM CHLORIDE 0.9 % IV BOLUS (SEPSIS)
1000.0000 mL | Freq: Once | INTRAVENOUS | Status: AC
Start: 2016-04-11 — End: 2016-04-11
  Administered 2016-04-11: 1000 mL via INTRAVENOUS

## 2016-04-11 NOTE — ED Triage Notes (Signed)
Pt states fever, headache, chills, epigastric pain since yesterday.  Went to UC with temp of 102.8.  They gave him tylenol and sent him here.  Family states pt needs to take his seizure meds now or he will seize.

## 2016-04-11 NOTE — ED Notes (Signed)
Pt has been asked x 2 for urine but states he is unable to urinate; Pt states he needs to get mother home for work tomorrow; pt made aware that urine is needed;

## 2016-04-11 NOTE — ED Triage Notes (Addendum)
Vomiting      Chills      Off  And  On   With   Onset  Of   Symptoms   X  2  Days    Chills   Diarrhea         And  Body  Aches       Fever

## 2016-04-11 NOTE — ED Notes (Signed)
Patient transported to X-ray 

## 2016-04-11 NOTE — ED Notes (Signed)
Spoke with J.Butler, Resident, Md regarding pt states he is needs to get mother home; Per Resident waiting for [facility] to assess the patient before discharging; Pt has tolerated oral meds and fluids; Resident made aware pt has declined to urinated x 2

## 2016-04-11 NOTE — ED Provider Notes (Signed)
[place]    CSN: [id number] Arrival date & time: 04/11/16  1633     History   Chief Complaint Chief Complaint  Patient presents with  . Emesis    HPI Eric Fernandez is a 41 y.o. male.   The history is provided by the patient.  Emesis  Severity:  Moderate Duration:  3 days Quality:  Stomach contents Progression:  Unchanged Chronicity:  New Recent urination:  Normal Relieved by:  Nothing Worsened by:  Nothing Ineffective treatments:  None tried Associated symptoms: chills, fever and myalgias   Associated symptoms: no diarrhea     Past Medical History:  Diagnosis Date  . Anxiety   . Depression   . Diabetes mellitus without complication ([place])   . Hypertension   . Mental disorder    border line bipolar  . Mild obesity   . Restless legs syndrome (RLS) 11/06/2012   Right leg  . Seizures (HCC)    Intractible  . Sleep apnea   . Suicide and self-inflicted injury ([place])   . Vertigo     Patient Active Problem List   Diagnosis Date Noted  . Benign paroxysmal positional vertigo 05/25/2014  . Diabetes mellitus, type 2 ([place]) 11/29/2013  . Restless legs syndrome (RLS) 11/06/2012  . 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 Dysphonia 05/06/2012  . Vocal cord paralysis, unilateral complete 04/11/2011    Past Surgical History:  Procedure Laterality Date  . BURN DEBRIDEMENT SURGERY    . DIRECT LARYNGOSCOPY WITH RADIAESSE INJECTION  04/11/2011   Procedure: DIRECT LARYNGOSCOPY WITH RADIAESSE INJECTION;  Surgeon: Melida Quitter, MD;  Location: [place];  Service: ENT;  Laterality: N/A;  MICRO DIRECT LARYNGOSCOPY WITH LEFT VOCAL FOLD RADIESSE INJECTION/JET VENTURI VENTILATION  . JOINT REPLACEMENT     rotator cuff surgery   lt  . SHOULDER SURGERY N/A   . VAGUS NERVE STIMULATOR INSERTION    . VAGUS NERVE STIMULATOR INSERTION Left 03/13/2016   Procedure: REPLACEMENT OF VAGAL NERVE STIMULATOR BATTERY;  Surgeon: Consuella Lose, MD;   Location: [place];  Service: Neurosurgery;  Laterality: Left;  REPLACEMENT OF VAGAL NERVE STIMULATOR BATTERY       Home Medications    Prior to Admission medications   Medication Sig Start Date End Date Taking? Authorizing Provider  amLODipine (NORVASC) 5 MG tablet TAKE ONE TABLET BY MOUTH DAILY 04/12/14   Kathrynn Ducking, MD  cholecalciferol (VITAMIN D) 1000 units tablet TAKE 1 TABLET TWICE DAILY 03/07/16   Kathrynn Ducking, MD  DILANTIN 100 MG ER capsule TAKE 3 CAPSULES (300MG  TOTAL) TWICE DAILY 02/16/16   Kathrynn Ducking, MD  escitalopram (LEXAPRO) 20 MG tablet Take 1 tablet (20 mg total) by mouth daily. 02/16/16   Kathrynn Ducking, MD  FARXIGA 10 MG TABS tablet Take 10 mg by mouth daily.  09/08/15   Historical Provider, MD  HYDROcodone-acetaminophen (NORCO/VICODIN) 5-325 MG tablet Take 1 tablet by mouth every 6 (six) hours as needed for moderate pain. 03/05/16   Kathrynn Ducking, MD  ibuprofen (ADVIL,MOTRIN) 800 MG tablet Take 800 mg by mouth every 8 (eight) hours as needed (for pain).    Historical Provider, MD  JANUVIA 100 MG tablet Take 100 mg by mouth daily.  08/26/15   Historical Provider, MD  Lacosamide (VIMPAT) 150 MG TABS Take 1 tablet (150 mg total) by mouth 2 (two) times daily. 01/23/16   Kathrynn Ducking, MD  lamoTRIgine (LAMICTAL) 100 MG tablet Take 3 tablets (300 mg  total) by mouth 2 (two) times daily. 03/21/16   Kathrynn Ducking, MD  lisinopril (PRINIVIL,ZESTRIL) 10 MG tablet TAKE 1 TABLET EVERY DAY 03/30/16   Kathrynn Ducking, MD  metFORMIN (GLUCOPHAGE) 500 MG tablet Take 1,000 mg by mouth 2 (two) times daily with a meal.  11/23/13   Historical Provider, MD  phenytoin (DILANTIN) 50 MG tablet CHEW AND SWALLOW 1 TABLET EVERY DAY 03/30/16   Kathrynn Ducking, MD  pramipexole (MIRAPEX) 1 MG tablet Take 1 tablet (1 mg total) by mouth at bedtime. 01/31/16   Kathrynn Ducking, MD  pravastatin (PRAVACHOL) 40 MG tablet Take 40 mg by mouth at bedtime.     Historical Provider, MD  zolpidem  (AMBIEN) 10 MG tablet TAKE 1 TABLET AT BEDTIME AS NEEDED FOR SLEEP 10/10/15   Kathrynn Ducking, MD    Family History Family History  Problem Relation Age of Onset  . Diabetes Mother   . ALS Maternal Aunt   . Cancer Maternal Uncle     Social History Social History  Substance Use Topics  . Smoking status: Former Research scientist (life sciences)  . Smokeless tobacco: Never Used     Comment: Quit when he was 41 years old  . Alcohol use No     Allergies   No known allergies   Review of Systems Review of Systems  Constitutional: Positive for chills and fever.  Gastrointestinal: Positive for vomiting. Negative for diarrhea.  Musculoskeletal: Positive for myalgias.  All other systems reviewed and are negative.    Physical Exam Triage Vital Signs ED Triage Vitals  Enc Vitals Group     BP 04/11/16 1721 116/71     Pulse Rate 04/11/16 1721 112     Resp 04/11/16 1721 18     Temp 04/11/16 1721 102.8 F (39.3 C)     Temp src --      SpO2 04/11/16 1721 100 %     Weight --      Height --      Head Circumference --      Peak Flow --      Pain Score 04/11/16 1722 8     Pain Loc --      Pain Edu? --      Excl. in [place]? --    No data found.   Updated Vital Signs BP 116/71 (BP Location: Right Arm)   Pulse 112   Temp 102.8 F (39.3 C)   Resp 18   SpO2 100%   Visual Acuity Right Eye Distance:   Left Eye Distance:   Bilateral Distance:    Right Eye Near:   Left Eye Near:    Bilateral Near:     Physical Exam  Constitutional: He is oriented to person, place, and time. He appears well-developed and well-nourished. No distress.  HENT:  Mouth/Throat: Oropharynx is clear and moist.  Neck: Normal range of motion. Neck supple.  Cardiovascular: Normal rate and regular rhythm.   Abdominal: Soft. Bowel sounds are normal. He exhibits no mass. There is tenderness. There is no rebound and no guarding. No hernia.  Lymphadenopathy:    He has no cervical adenopathy.  Neurological: He is alert and  oriented to person, place, and time.  Skin: Skin is warm and dry.  Nursing note and vitals reviewed.    UC Treatments / Results  Labs (all labs ordered are listed, but only abnormal results are displayed) Labs Reviewed - No data to display  EKG  EKG Interpretation None  Radiology No results found.  Procedures Procedures (including critical care time)  Medications Ordered in UC Medications  acetaminophen (TYLENOL) tablet 650 mg (650 mg Oral Given 04/11/16 1750)     Initial Impression / Assessment and Plan / UC Course  I have reviewed the triage vital signs and the nursing notes.  Pertinent labs & imaging results that were available during my care of the patient were reviewed by me and considered in my medical decision making (see chart for details).     Sent for ivf and seizure precautions  Final Clinical Impressions(s) / UC Diagnoses   Final diagnoses:  Non-intractable cyclical vomiting with nausea  Seizure disorder [facility])    New Prescriptions New Prescriptions   No medications on file     Billy Fischer, MD 04/11/16 1758

## 2016-04-11 NOTE — Discharge Instructions (Signed)
Go directly to ER  

## 2016-04-12 ENCOUNTER — Ambulatory Visit: Payer: Self-pay | Admitting: Neurology

## 2016-04-12 LAB — I-STAT CG4 LACTIC ACID, ED
LACTIC ACID, VENOUS: 1.77 mmol/L (ref 0.5–1.9)
LACTIC ACID, VENOUS: 1.72 mmol/L (ref 0.5–1.9)

## 2016-04-12 NOTE — ED Provider Notes (Signed)
[facility] DEPT Provider Note   CSN: [id number] Arrival date & time: 04/11/16  1806     History   Chief Complaint Chief Complaint  Patient presents with  . Emesis  . Fever    HPI Eric Fernandez is a 41 y.o. male.  The history is provided by the patient.  Emesis   This is a new problem. The current episode started more than 2 days ago. The problem occurs 2 to 4 times per day. The problem has been gradually improving. The emesis has an appearance of stomach contents. The maximum temperature recorded prior to his arrival was 102 to 102.9 F. The fever has been present for less than 1 day. Associated symptoms include abdominal pain and a fever. Pertinent negatives include no arthralgias, no chills, no cough and no diarrhea.  Fever   Associated symptoms include vomiting. Pertinent negatives include no chest pain, no diarrhea, no sore throat and no cough.    Past Medical History:  Diagnosis Date  . Anxiety   . Depression   . Diabetes mellitus without complication ([place])   . Hypertension   . Mental disorder    border line bipolar  . Mild obesity   . Restless legs syndrome (RLS) 11/06/2012   Right leg  . Seizures (HCC)    Intractible  . Sleep apnea   . Suicide and self-inflicted injury ([place])   . Vertigo     Patient Active Problem List   Diagnosis Date Noted  . Benign paroxysmal positional vertigo 05/25/2014  . Diabetes mellitus, type 2 ([place]) 11/29/2013  . Restless legs syndrome (RLS) 11/06/2012  . 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 Dysphonia 05/06/2012  . Vocal cord paralysis, unilateral complete 04/11/2011    Past Surgical History:  Procedure Laterality Date  . BURN DEBRIDEMENT SURGERY    . DIRECT LARYNGOSCOPY WITH RADIAESSE INJECTION  04/11/2011   Procedure: DIRECT LARYNGOSCOPY WITH RADIAESSE INJECTION;  Surgeon: Melida Quitter, MD;  Location: [facility];  Service: ENT;  Laterality: N/A;  MICRO DIRECT LARYNGOSCOPY WITH LEFT VOCAL  FOLD RADIESSE INJECTION/JET VENTURI VENTILATION  . JOINT REPLACEMENT     rotator cuff surgery   lt  . SHOULDER SURGERY N/A   . VAGUS NERVE STIMULATOR INSERTION    . VAGUS NERVE STIMULATOR INSERTION Left 03/13/2016   Procedure: REPLACEMENT OF VAGAL NERVE STIMULATOR BATTERY;  Surgeon: Consuella Lose, MD;  Location: [facility];  Service: Neurosurgery;  Laterality: Left;  REPLACEMENT OF VAGAL NERVE STIMULATOR BATTERY       Home Medications    Prior to Admission medications   Medication Sig Start Date End Date Taking? Authorizing Provider  amLODipine (NORVASC) 5 MG tablet TAKE ONE TABLET BY MOUTH DAILY 04/12/14  Yes Kathrynn Ducking, MD  cholecalciferol (VITAMIN D) 1000 units tablet TAKE 1 TABLET TWICE DAILY 03/07/16  Yes Kathrynn Ducking, MD  DILANTIN 100 MG ER capsule TAKE 3 CAPSULES (300MG  TOTAL) TWICE DAILY 02/16/16  Yes Kathrynn Ducking, MD  escitalopram (LEXAPRO) 20 MG tablet Take 1 tablet (20 mg total) by mouth daily. 02/16/16  Yes Kathrynn Ducking, MD  FARXIGA 10 MG TABS tablet Take 10 mg by mouth daily.  09/08/15  Yes Historical Provider, MD  HYDROcodone-acetaminophen (NORCO/VICODIN) 5-325 MG tablet Take 1 tablet by mouth every 6 (six) hours as needed for moderate pain. 03/05/16  Yes Kathrynn Ducking, MD  ibuprofen (ADVIL,MOTRIN) 800 MG tablet Take 800 mg by mouth every 8 (eight) hours as needed (for pain).  Yes Historical Provider, MD  JANUVIA 100 MG tablet Take 100 mg by mouth daily.  08/26/15  Yes Historical Provider, MD  Lacosamide (VIMPAT) 150 MG TABS Take 1 tablet (150 mg total) by mouth 2 (two) times daily. 01/23/16  Yes Kathrynn Ducking, MD  lamoTRIgine (LAMICTAL) 100 MG tablet Take 3 tablets (300 mg total) by mouth 2 (two) times daily. 03/21/16  Yes Kathrynn Ducking, MD  lisinopril (PRINIVIL,ZESTRIL) 10 MG tablet TAKE 1 TABLET EVERY DAY 03/30/16  Yes Kathrynn Ducking, MD  metFORMIN (GLUCOPHAGE) 500 MG tablet Take 1,000 mg by mouth 2 (two) times daily with a meal.  11/23/13  Yes Historical  Provider, MD  phenytoin (DILANTIN) 50 MG tablet CHEW AND SWALLOW 1 TABLET EVERY DAY 03/30/16  Yes Kathrynn Ducking, MD  pramipexole (MIRAPEX) 1 MG tablet Take 1 tablet (1 mg total) by mouth at bedtime. 01/31/16  Yes Kathrynn Ducking, MD  pravastatin (PRAVACHOL) 40 MG tablet Take 40 mg by mouth at bedtime.    Yes Historical Provider, MD  zolpidem (AMBIEN) 10 MG tablet TAKE 1 TABLET AT BEDTIME AS NEEDED FOR SLEEP 10/10/15  Yes Kathrynn Ducking, MD    Family History Family History  Problem Relation Age of Onset  . Diabetes Mother   . ALS Maternal Aunt   . Cancer Maternal Uncle     Social History Social History  Substance Use Topics  . Smoking status: Former Research scientist (life sciences)  . Smokeless tobacco: Never Used     Comment: Quit when he was 41 years old  . Alcohol use No     Allergies   No known allergies   Review of Systems Review of Systems  Constitutional: Positive for fever. Negative for chills.  HENT: Negative for ear pain and sore throat.   Eyes: Negative for pain and visual disturbance.  Respiratory: Negative for cough and shortness of breath.   Cardiovascular: Negative for chest pain and palpitations.  Gastrointestinal: Positive for abdominal pain, nausea and vomiting. Negative for constipation and diarrhea.  Genitourinary: Negative for difficulty urinating, dysuria and hematuria.  Musculoskeletal: Negative for arthralgias and back pain.  Skin: Negative for color change and rash.  Neurological: Negative for seizures and syncope.  All other systems reviewed and are negative.    Physical Exam Updated Vital Signs BP 104/64   Pulse 86   Temp 98.7 F (37.1 C) (Oral)   Resp 18   Ht 5\' 8"  (1.727 m)   Wt 113.4 kg   SpO2 97%   BMI 38.01 kg/m   Physical Exam  Constitutional: He is oriented to person, place, and time. He appears well-developed and well-nourished.  HENT:  Head: Normocephalic and atraumatic.  Eyes: Conjunctivae are normal.  Neck: Neck supple.  Cardiovascular:  Normal rate and regular rhythm.   No murmur heard. Pulmonary/Chest: Effort normal and breath sounds normal. No respiratory distress.  Abdominal: Soft. He exhibits no distension and no mass. There is tenderness (mild diffuse tenderness). There is no rebound and no guarding. No hernia.  Musculoskeletal: He exhibits no edema, tenderness or deformity.  Neurological: He is alert and oriented to person, place, and time.  Skin: Skin is warm and dry.  Psychiatric: He has a normal mood and affect.  Nursing note and vitals reviewed.    ED Treatments / Results  Labs (all labs ordered are listed, but only abnormal results are displayed) Labs Reviewed  COMPREHENSIVE METABOLIC PANEL - Abnormal; Notable for the following:       Result Value  Sodium 130 (*)    Chloride 98 (*)    CO2 19 (*)    Glucose, Bld 175 (*)    Alkaline Phosphatase 151 (*)    All other components within normal limits  CBC - Abnormal; Notable for the following:    WBC 13.6 (*)    All other components within normal limits  LIPASE, BLOOD  I-STAT CG4 LACTIC ACID, ED  I-STAT CG4 LACTIC ACID, ED    EKG  EKG Interpretation None       Radiology Dg Chest 2 View  Result Date: 04/11/2016 CLINICAL DATA:  Left-sided chest pain. EXAM: CHEST  2 VIEW COMPARISON:  None. FINDINGS: The heart size and mediastinal contours are within normal limits. Vagus nerve stimulator device is again seen overlying the left anterior chest wall with leads projecting up the left side of the neck. Both lungs are clear. No pneumothorax is identified. The visualized skeletal structures are unremarkable. IMPRESSION: No active cardiopulmonary disease. Electronically Signed   By: Ashley Royalty M.D.   On: 04/11/2016 21:03    Procedures Procedures (including critical care time)  Medications Ordered in ED Medications  ondansetron (ZOFRAN-ODT) disintegrating tablet 4 mg (4 mg Oral Given 04/11/16 1820)  phenytoin (DILANTIN) ER capsule 300 mg (300 mg Oral  Given 04/11/16 2009)  lamoTRIgine (LAMICTAL) tablet 300 mg (300 mg Oral Given 04/11/16 2010)  sodium chloride 0.9 % bolus 1,000 mL (0 mLs Intravenous Stopped 04/11/16 2121)  ondansetron (ZOFRAN) injection 4 mg (4 mg Intravenous Given 04/11/16 2006)  lacosamide (VIMPAT) tablet 150 mg (150 mg Oral Given 04/11/16 2010)     Initial Impression / Assessment and Plan / ED Course  I have reviewed the triage vital signs and the nursing notes.  Pertinent labs & imaging results that were available during my care of the patient were reviewed by me and considered in my medical decision making (see chart for details).    Pt with hx as above p/w about 3 days of vomiting and fever since yesterday. He went to UC earlier today who sent him here for IVF and concern about him not getting his seizure meds. Pt has had no vomiting here. He was given Zofran and IVF. Reports significant improvement in his nausea. His abd was mildly tender diffusely but no concern at this time for significant intraabdominal pathology. Pt was given home seizure meds PO and he tolerated without difficulty. He is requesting discharge. His labs notable for mild leukocytosis, mild hyponatremia, and mild metabolic acidosis. In light of other labs, this is within his baseline range. I suspect viral gastritis. Return precautions discussed in detail. I feel pt stable for d/c at this time. Plan to f/u with PCP.  Final Clinical Impressions(s) / ED Diagnoses   Final diagnoses:  Non-intractable vomiting with nausea, unspecified vomiting type  Fever, unspecified fever cause    New Prescriptions Discharge Medication List as of 04/11/2016 10:20 PM       Clifton James, MD [id number]    Delora Fuel, MD [id number]

## 2016-04-17 ENCOUNTER — Ambulatory Visit: Payer: Commercial Managed Care - HMO | Admitting: Adult Health

## 2016-04-25 ENCOUNTER — Encounter (HOSPITAL_COMMUNITY): Payer: Self-pay | Admitting: Emergency Medicine

## 2016-04-25 ENCOUNTER — Emergency Department (HOSPITAL_COMMUNITY)
Admission: EM | Admit: 2016-04-25 | Discharge: 2016-04-25 | Disposition: A | Discharge status: Home / Self Care | Payer: Medicare HMO | Attending: Emergency Medicine | Admitting: Emergency Medicine

## 2016-04-25 DIAGNOSIS — Z5181 Encounter for therapeutic drug level monitoring: Secondary | ICD-10-CM | POA: Diagnosis not present

## 2016-04-25 DIAGNOSIS — R892 Abnormal level of other drugs, medicaments and biological substances in specimens from other organs, systems and tissues: Secondary | ICD-10-CM | POA: Diagnosis not present

## 2016-04-25 DIAGNOSIS — Z87891 Personal history of nicotine dependence: Secondary | ICD-10-CM | POA: Insufficient documentation

## 2016-04-25 DIAGNOSIS — Z7984 Long term (current) use of oral hypoglycemic drugs: Secondary | ICD-10-CM | POA: Diagnosis not present

## 2016-04-25 DIAGNOSIS — E119 Type 2 diabetes mellitus without complications: Secondary | ICD-10-CM | POA: Diagnosis not present

## 2016-04-25 DIAGNOSIS — Z79899 Other long term (current) drug therapy: Secondary | ICD-10-CM | POA: Insufficient documentation

## 2016-04-25 DIAGNOSIS — R7889 Finding of other specified substances, not normally found in blood: Secondary | ICD-10-CM

## 2016-04-25 DIAGNOSIS — I10 Essential (primary) hypertension: Secondary | ICD-10-CM | POA: Insufficient documentation

## 2016-04-25 DIAGNOSIS — R569 Unspecified convulsions: Secondary | ICD-10-CM | POA: Diagnosis not present

## 2016-04-25 DIAGNOSIS — G40909 Epilepsy, unspecified, not intractable, without status epilepticus: Secondary | ICD-10-CM | POA: Diagnosis not present

## 2016-04-25 LAB — CBG MONITORING, ED: Glucose-Capillary: 183 mg/dL — ABNORMAL HIGH (ref 65–99)

## 2016-04-25 LAB — I-STAT CHEM 8, ED
BUN: 15 mg/dL (ref 6–20)
CALCIUM ION: 1.16 mmol/L (ref 1.15–1.40)
CHLORIDE: 103 mmol/L (ref 101–111)
Creatinine, Ser: 0.7 mg/dL (ref 0.61–1.24)
Glucose, Bld: 184 mg/dL — ABNORMAL HIGH (ref 65–99)
HCT: 42 % (ref 39.0–52.0)
Hemoglobin: 14.3 g/dL (ref 13.0–17.0)
Potassium: 4.2 mmol/L (ref 3.5–5.1)
SODIUM: 137 mmol/L (ref 135–145)
TCO2: 25 mmol/L (ref 0–100)

## 2016-04-25 LAB — PHENYTOIN LEVEL, TOTAL: Phenytoin Lvl: 9.8 ug/mL — ABNORMAL LOW (ref 10.0–20.0)

## 2016-04-25 MED ORDER — PHENYTOIN SODIUM EXTENDED 100 MG PO CAPS
300.0000 mg | ORAL_CAPSULE | Freq: Once | ORAL | Status: AC
  Administered 2016-04-25: 300 mg via ORAL
  Filled 2016-04-25: qty 3

## 2016-04-25 MED ORDER — SODIUM CHLORIDE 0.9 % IV BOLUS (SEPSIS)
1000.0000 mL | Freq: Once | INTRAVENOUS | Status: AC
  Administered 2016-04-25: 1000 mL via INTRAVENOUS

## 2016-04-25 NOTE — Discharge Instructions (Signed)
Continue taking your medications as prescribed. Your Dilantin level was found to be low today. You were given a dose of your Dilantin to try and prevent further seizure activity. Follow-up with your neurologist to discuss changes to your daily medications.

## 2016-04-25 NOTE — ED Provider Notes (Signed)
[facility] DEPT Provider Note   CSN: [id number] Arrival date & time: 04/25/16  1928     History   Chief Complaint No chief complaint on file.   HPI Eric Fernandez is a 41 y.o. male.  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 Seizures lasted a few minutes each, per mother. Patient reportedly fell out of the chair onto the floor. He has no complaints of pain, but does have an abrasion to the right side of his parietal scalp. He was found to be unconscious with snoring respirations upon EMS arrival; likely postictal. Patient is now alert and mildly anxious. He states that he does not like hospitals. He denies any nausea, vomiting, recent fevers or viral illness, or incontinence. His last seizure was reportedly one month ago. Patient reports compliance with all antiepileptic medications.  Neurologist - Dr. Jannifer Franklin   The history is provided by the patient. No language interpreter was used.    Past Medical History:  Diagnosis Date  . Anxiety   . Depression   . Diabetes mellitus without complication ([place])   . Hypertension   . Mental disorder    border line bipolar  . Mild obesity   . Restless legs syndrome (RLS) 11/06/2012   Right leg  . Seizures (HCC)    Intractible  . Sleep apnea   . Suicide and self-inflicted injury ([place])   . Vertigo     Patient Active Problem List   Diagnosis Date Noted  . Benign paroxysmal positional vertigo 05/25/2014  . Diabetes mellitus, type 2 ([place]) 11/29/2013  . Restless legs syndrome (RLS) 11/06/2012  . 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 Dysphonia 05/06/2012  . Vocal cord paralysis, unilateral complete 04/11/2011    Past Surgical History:  Procedure Laterality Date  . BURN DEBRIDEMENT SURGERY    . DIRECT  LARYNGOSCOPY WITH RADIAESSE INJECTION  04/11/2011   Procedure: DIRECT LARYNGOSCOPY WITH RADIAESSE INJECTION;  Surgeon: Melida Quitter, MD;  Location: [facility];  Service: ENT;  Laterality: N/A;  MICRO DIRECT LARYNGOSCOPY WITH LEFT VOCAL FOLD RADIESSE INJECTION/JET VENTURI VENTILATION  . JOINT REPLACEMENT     rotator cuff surgery   lt  . SHOULDER SURGERY N/A   . VAGUS NERVE STIMULATOR INSERTION    . VAGUS NERVE STIMULATOR INSERTION Left 03/13/2016   Procedure: REPLACEMENT OF VAGAL NERVE STIMULATOR BATTERY;  Surgeon: Consuella Lose, MD;  Location: [facility];  Service: Neurosurgery;  Laterality: Left;  REPLACEMENT OF VAGAL NERVE STIMULATOR BATTERY       Home Medications    Prior to Admission medications   Medication Sig Start Date End Date Taking? Authorizing Provider  amLODipine (NORVASC) 5 MG tablet TAKE ONE TABLET BY MOUTH DAILY 04/12/14  Yes Kathrynn Ducking, MD  cholecalciferol (VITAMIN D) 1000 units tablet TAKE 1 TABLET TWICE DAILY 03/07/16  Yes Kathrynn Ducking, MD  DILANTIN 100 MG ER capsule TAKE 3 CAPSULES (300MG  TOTAL) TWICE DAILY 02/16/16  Yes Kathrynn Ducking, MD  escitalopram (LEXAPRO) 20 MG tablet Take 1 tablet (20 mg total) by mouth daily. 02/16/16  Yes Kathrynn Ducking, MD  FARXIGA 10 MG TABS tablet Take 10 mg by mouth daily.  09/08/15  Yes Historical Provider, MD  HYDROcodone-acetaminophen (NORCO/VICODIN) 5-325 MG tablet Take 1 tablet by mouth every 6 (six) hours as needed for moderate pain. 03/05/16  Yes Kathrynn Ducking,  MD  ibuprofen (ADVIL,MOTRIN) 800 MG tablet Take 800 mg by mouth every 8 (eight) hours as needed (for pain).   Yes Historical Provider, MD  JANUVIA 100 MG tablet Take 100 mg by mouth daily.  08/26/15  Yes Historical Provider, MD  Lacosamide (VIMPAT) 150 MG TABS Take 1 tablet (150 mg total) by mouth 2 (two) times daily. 01/23/16  Yes Kathrynn Ducking, MD  lamoTRIgine (LAMICTAL) 100 MG tablet Take 3 tablets (300 mg total) by mouth 2 (two) times daily. 03/21/16  Yes Kathrynn Ducking, MD  lisinopril (PRINIVIL,ZESTRIL) 10 MG tablet TAKE 1 TABLET EVERY DAY 03/30/16  Yes Kathrynn Ducking, MD  metFORMIN (GLUCOPHAGE) 500 MG tablet Take 1,000 mg by mouth 2 (two) times daily with a meal.  11/23/13  Yes Historical Provider, MD  phenytoin (DILANTIN) 50 MG tablet Chew 50 mg by mouth daily. Chew one tablet along with the ER 100mg  TID tablet per patient   Yes Historical Provider, MD  pramipexole (MIRAPEX) 1 MG tablet Take 1 tablet (1 mg total) by mouth at bedtime. 01/31/16  Yes Kathrynn Ducking, MD  pravastatin (PRAVACHOL) 40 MG tablet Take 40 mg by mouth at bedtime.    Yes Historical Provider, MD  zolpidem (AMBIEN) 10 MG tablet TAKE 1 TABLET AT BEDTIME AS NEEDED FOR SLEEP 10/10/15  Yes Kathrynn Ducking, MD  phenytoin (DILANTIN) 50 MG tablet CHEW AND SWALLOW 1 TABLET EVERY DAY Patient not taking: Reported on 04/25/2016 03/30/16   Kathrynn Ducking, MD    Family History Family History  Problem Relation Age of Onset  . Diabetes Mother   . ALS Maternal Aunt   . Cancer Maternal Uncle     Social History Social History  Substance Use Topics  . Smoking status: Former Research scientist (life sciences)  . Smokeless tobacco: Never Used     Comment: Quit when he was 41 years old  . Alcohol use No     Allergies   No known allergies   Review of Systems Review of Systems Ten systems reviewed and are negative for acute change, except as noted in the HPI.    Physical Exam Updated Vital Signs BP 124/70   Pulse 102   Temp 99 F (37.2 C) (Oral)   Resp 19   SpO2 93%   Physical Exam  Constitutional: He is oriented to person, place, and time. He appears well-developed and well-nourished. No distress.  Nontoxic and in NAD  HENT:  Head: Normocephalic and atraumatic.  Mouth/Throat: Oropharynx is clear and moist.  Eyes: Conjunctivae and EOM are normal. Pupils are equal, round, and reactive to light. No scleral icterus.  Neck:  C collar in place. Midine palpated without bony deformities, step-offs, or  crepitus. Patient has no complaints of pain to palpation of the cervical midline. Cervical collar removed. C-spine cleared.  Cardiovascular: Regular rhythm and intact distal pulses.   Mild tachycardia.  Pulmonary/Chest: Effort normal. No respiratory distress. He has no wheezes.  Respirations even and unlabored  Musculoskeletal: Normal range of motion.  Neurological: He is alert and oriented to person, place, and time. No cranial nerve deficit. He exhibits normal muscle tone. Coordination normal.  GCS 15. Speech is goal oriented. No cranial nerve deficits appreciated; symmetric eyebrow raise, no facial drooping, tongue midline. Patient has equal grip strength bilaterally with 5/5 strength against resistance in all major muscle groups bilaterally. Sensation to light touch intact. Patient moves extremities without ataxia. Patient ambulatory with steady gait.  Skin: Skin is warm and dry. No  rash noted. He is not diaphoretic. No erythema. No pallor.  Psychiatric: He has a normal mood and affect. His behavior is normal.  Nursing note and vitals reviewed.    ED Treatments / Results  Labs (all labs ordered are listed, but only abnormal results are displayed) Labs Reviewed  PHENYTOIN LEVEL, TOTAL - Abnormal; Notable for the following:       Result Value   Phenytoin Lvl 9.8 (*)    All other components within normal limits  I-STAT CHEM 8, ED - Abnormal; Notable for the following:    Glucose, Bld 184 (*)    All other components within normal limits  CBG MONITORING, ED - Abnormal; Notable for the following:    Glucose-Capillary 183 (*)    All other components within normal limits  LAMOTRIGINE LEVEL    EKG  EKG Interpretation None       Radiology No results found.  Procedures Procedures (including critical care time)  Medications Ordered in ED Medications  sodium chloride 0.9 % bolus 1,000 mL (0 mLs Intravenous Stopped 04/25/16 2244)  phenytoin (DILANTIN) ER capsule 300 mg (300 mg  Oral Given 04/25/16 2224)     Initial Impression / Assessment and Plan / ED Course  I have reviewed the triage vital signs and the nursing notes.  Pertinent labs & imaging results that were available during my care of the patient were reviewed by me and considered in my medical decision making (see chart for details).     41 year old male with history of known seizure disorder presents to the emergency department after 2 witnessed seizures today. No complaint of recent fever or viral illness. He has evidence of mild tongue biting to the right lateral aspect of the tongue from seizure activity. Abrasion also noted to right parietal scalp. Patient mentating at baseline on arrival. He was in a cervical collar put on by EMS. Cervical spine cleared. Patient has no complaints of pain. Midline nontender. Neurologic exam also nonfocal. Patient with stable vital signs while in the emergency department.   Laboratory workup obtained with stable electrolytes and CBG. Patient's Dilantin level is subtherapeutic. He was orally loaded with Dilantin in the ED. Patient requesting discharge. He has had no witnessed seizure activity since arrival. I have encouraged the patient to follow-up with his neurologist to discuss any necessary changes to his antiepileptic medications. Return precautions discussed and provided. Patient discharged in stable condition with no unaddressed concerns.   Final Clinical Impressions(s) / ED Diagnoses   Final diagnoses:  Seizure-like activity ([place])  Subtherapeutic serum dilantin level    New Prescriptions New Prescriptions   No medications on file     Antonietta Breach, PA-C 04/25/16 2251    Milton Ferguson, MD 04/25/16 2320

## 2016-04-25 NOTE — ED Triage Notes (Signed)
Per EMS,  Pt has hx of seizures. Pt had 2 witnessed seizures. They lasted a few minutes each. Pt fell out of chair onto floor. Pt has abrasian to R side of head. Pt was unconscious with snoring respirations upon EMS arrival. CBG-237. 157/92, 113. Spo2 96.

## 2016-04-27 LAB — LAMOTRIGINE LEVEL: LAMOTRIGINE LVL: 3.6 ug/mL (ref 2.0–20.0)

## 2016-04-30 ENCOUNTER — Other Ambulatory Visit: Payer: Self-pay

## 2016-04-30 NOTE — Patient Outreach (Signed)
[facility]) Care Management  04/30/2016  Eric Fernandez February 16, 1975 [id number]   Telephone call to patient for ED utilization screen.  No answer.  HIP AA compliant voice message left.    Plan: RN Health Coach will attempt patient again in the next 10 business days.    [name], RN, MSN [place] [phone]

## 2016-05-01 DIAGNOSIS — G4733 Obstructive sleep apnea (adult) (pediatric): Secondary | ICD-10-CM | POA: Diagnosis not present

## 2016-05-02 ENCOUNTER — Other Ambulatory Visit: Payer: Self-pay

## 2016-05-02 NOTE — Patient Outreach (Signed)
[facility]) Care Management  05/02/2016  Eric Fernandez 04/16/1975 [id number]   2nd telephone call to patient for ED utilization.  No answer.  HIPAA compliant voice message left.  Plan: RN Health Coach will attempt patient again in the next ten business days.    [name], RN, MSN [place] [phone]

## 2016-05-03 ENCOUNTER — Telehealth: Payer: Self-pay | Admitting: Neurology

## 2016-05-03 MED ORDER — LACOSAMIDE 150 MG PO TABS: 1.0000 | ORAL_TABLET | Freq: Two times a day (BID) | ORAL | 5 refills | Status: DC

## 2016-05-03 NOTE — Telephone Encounter (Signed)
We will refill the Vimpat.

## 2016-05-03 NOTE — Addendum Note (Signed)
Addended by: Kathrynn Ducking on: 05/03/2016 04:34 PM   Modules accepted: Orders

## 2016-05-03 NOTE — Telephone Encounter (Signed)
Patient called office requesting reill for Lacosamide (VIMPAT) 150 MG TABS.  [place] home delivery.

## 2016-05-03 NOTE — Telephone Encounter (Signed)
Called and spoke with patient. He needs refill on vimpat. He only has about a week left. He went to ED 04/26/16 due to seizure activity. His dilantin level was subtherapeutic in ED. Phenytoin level 9.8, lamotrigine level 3.6. He was given oral dilantin in ED that day. He reports no further seizures since visit.   He verified he has rx lamotrigine. I call [employer] and spoke with Rayna. She veriified he has 3 refills left on this rx.   Verified he has appt with MM,NP on 05/21/16. He is wanting to possibly make adjustments to dilantin dose and VNS.

## 2016-05-04 ENCOUNTER — Other Ambulatory Visit: Payer: Self-pay | Admitting: *Deleted

## 2016-05-04 MED ORDER — LACOSAMIDE 150 MG PO TABS: 1.0000 | ORAL_TABLET | Freq: Two times a day (BID) | ORAL | 1 refills | Status: DC

## 2016-05-04 NOTE — Telephone Encounter (Signed)
Called pt. Informed him rx sent to [facility] as requested. He verbalized understanding.

## 2016-05-04 NOTE — Telephone Encounter (Signed)
Faxed printed/signed rx vimpat to pt pharmacy [facility]) per request. Fax: [phone]. Received confirmation.

## 2016-05-07 ENCOUNTER — Other Ambulatory Visit: Payer: Self-pay

## 2016-05-07 ENCOUNTER — Telehealth: Payer: Self-pay | Admitting: Neurology

## 2016-05-07 NOTE — Telephone Encounter (Signed)
I called the patient. He continues to have frequent seizures. I will work him into our office tomorrow, see him at 10:00.

## 2016-05-07 NOTE — Patient Outreach (Signed)
[facility]) Care Management  05/07/2016  Eric Fernandez Jun 07, 1975 [id number]   3rd telephone call to patient for ED Utilization referral.  No answer.  HIPAA compliant voice message left.  Plan: RN Health Coach will send letter to attempt outreach.    [name], RN, MSN [place] [phone]

## 2016-05-08 ENCOUNTER — Encounter (INDEPENDENT_AMBULATORY_CARE_PROVIDER_SITE_OTHER): Payer: Self-pay

## 2016-05-08 ENCOUNTER — Encounter: Payer: Self-pay | Admitting: Neurology

## 2016-05-08 ENCOUNTER — Ambulatory Visit (INDEPENDENT_AMBULATORY_CARE_PROVIDER_SITE_OTHER): Payer: Medicare HMO | Admitting: Neurology

## 2016-05-08 VITALS — BP 130/70 | HR 97 | Ht 68.0 in | Wt 240.5 lb

## 2016-05-08 DIAGNOSIS — G40319 Generalized idiopathic epilepsy and epileptic syndromes, intractable, without status epilepticus: Secondary | ICD-10-CM

## 2016-05-08 MED ORDER — DILANTIN 100 MG PO CAPS: ORAL_CAPSULE | ORAL | 3 refills | Status: DC

## 2016-05-08 NOTE — Telephone Encounter (Signed)
Placed pt on schedule today at 10am

## 2016-05-08 NOTE — Progress Notes (Signed)
Reason for visit: Intractable seizures  Eric Fernandez is an 41 y.o. male  History of present illness:  Eric Fernandez is a 41 year old right-handed white male with a history of intractable seizures. The patient has had several ER visits for seizures on February 28 and on 04/25/2016. The patient had a seizure yesterday, he did not go to emergency room for this event. He comes in to this office for reevaluation of the intractable seizure problem. Upon review of the medications, it appears that the patient did not ramp up on the dose of Lamictal as previously outlined, he believes that he is only taking 200 mg twice daily instead of 300 mg twice daily of Lamictal. The patient is not sure that he is even taking the Vimpat. The most recent Dilantin level on March 14 was 9.8, slightly low. The patient apparently has gone up on the Dilantin taking 400 mg in the morning and 300 mg in the evening, going from 600 mg daily to 700 mg daily. The patient seemed to be tolerating this dose increase so far. The patient appears to be confused on how he is taking his medications.  Past Medical History:  Diagnosis Date  . Anxiety   . Depression   . Diabetes mellitus without complication ([place])   . Hypertension   . Mental disorder    border line bipolar  . Mild obesity   . Restless legs syndrome (RLS) 11/06/2012   Right leg  . Seizures (HCC)    Intractible  . Sleep apnea   . Suicide and self-inflicted injury ([place])   . Vertigo     Past Surgical History:  Procedure Laterality Date  . BURN DEBRIDEMENT SURGERY    . DIRECT LARYNGOSCOPY WITH RADIAESSE INJECTION  04/11/2011   Procedure: DIRECT LARYNGOSCOPY WITH RADIAESSE INJECTION;  Surgeon: Melida Quitter, MD;  Location: [facility];  Service: ENT;  Laterality: N/A;  MICRO DIRECT LARYNGOSCOPY WITH LEFT VOCAL FOLD RADIESSE INJECTION/JET VENTURI VENTILATION  . JOINT REPLACEMENT     rotator cuff surgery   lt  . SHOULDER SURGERY N/A   . VAGUS NERVE STIMULATOR INSERTION      . VAGUS NERVE STIMULATOR INSERTION Left 03/13/2016   Procedure: REPLACEMENT OF VAGAL NERVE STIMULATOR BATTERY;  Surgeon: Consuella Lose, MD;  Location: [facility];  Service: Neurosurgery;  Laterality: Left;  REPLACEMENT OF VAGAL NERVE STIMULATOR BATTERY    Family History  Problem Relation Age of Onset  . Diabetes Mother   . ALS Maternal Aunt   . Cancer Maternal Uncle     Social history:  reports that he has quit smoking. He has never used smokeless tobacco. He reports that he does not drink alcohol or use drugs.    Allergies  Allergen Reactions  . No Known Allergies     Medications:  Prior to Admission medications   Medication Sig Start Date End Date Taking? Authorizing Provider  amLODipine (NORVASC) 5 MG tablet TAKE ONE TABLET BY MOUTH DAILY 04/12/14  Yes Kathrynn Ducking, MD  cholecalciferol (VITAMIN D) 1000 units tablet TAKE 1 TABLET TWICE DAILY 03/07/16  Yes Kathrynn Ducking, MD  DILANTIN 100 MG ER capsule Take 4 capsules in the morning and 3 in the evening 05/08/16  Yes Kathrynn Ducking, MD  escitalopram (LEXAPRO) 20 MG tablet Take 1 tablet (20 mg total) by mouth daily. 02/16/16  Yes Kathrynn Ducking, MD  FARXIGA 10 MG TABS tablet Take 10 mg by mouth daily.  09/08/15  Yes Historical Provider, MD  HYDROcodone-acetaminophen (NORCO/VICODIN) 5-325 MG tablet Take 1 tablet by mouth every 6 (six) hours as needed for moderate pain. 03/05/16  Yes Kathrynn Ducking, MD  ibuprofen (ADVIL,MOTRIN) 800 MG tablet Take 800 mg by mouth every 8 (eight) hours as needed (for pain).   Yes Historical Provider, MD  JANUVIA 100 MG tablet Take 100 mg by mouth daily.  08/26/15  Yes Historical Provider, MD  Lacosamide (VIMPAT) 150 MG TABS Take 1 tablet (150 mg total) by mouth 2 (two) times daily. 05/04/16  Yes Kathrynn Ducking, MD  lamoTRIgine (LAMICTAL) 100 MG tablet Take 3 tablets (300 mg total) by mouth 2 (two) times daily. 03/21/16  Yes Kathrynn Ducking, MD  lisinopril (PRINIVIL,ZESTRIL) 10 MG tablet TAKE 1 TABLET  EVERY DAY 03/30/16  Yes Kathrynn Ducking, MD  metFORMIN (GLUCOPHAGE) 500 MG tablet Take 1,000 mg by mouth 2 (two) times daily with a meal.  11/23/13  Yes Historical Provider, MD  pramipexole (MIRAPEX) 1 MG tablet Take 1 tablet (1 mg total) by mouth at bedtime. 01/31/16  Yes Kathrynn Ducking, MD  pravastatin (PRAVACHOL) 40 MG tablet Take 40 mg by mouth at bedtime.    Yes Historical Provider, MD  zolpidem (AMBIEN) 10 MG tablet TAKE 1 TABLET AT BEDTIME AS NEEDED FOR SLEEP 10/10/15  Yes Kathrynn Ducking, MD    ROS:  Out of a complete 14 system review of symptoms, the patient complains only of the following symptoms, and all other reviewed systems are negative.  Memory loss, confusion, seizures Restless legs Depression, decreased energy  Blood pressure 130/70, pulse 97, height 5\' 8"  (1.727 m), weight 240 lb 8 oz (109.1 kg), SpO2 98 %.  Physical Exam  General: The patient is alert and cooperative at the time of the examination.  Skin: No significant peripheral edema is noted.   Neurologic Exam  Mental status: The patient is alert and oriented x 3 at the time of the examination. The patient has apparent normal recent and remote memory, with an apparently normal attention span and concentration ability.   Cranial nerves: Facial symmetry is present. Speech is normal, no aphasia or dysarthria is noted. Extraocular movements are full. Visual fields are full.  Motor: The patient has good strength in all 4 extremities.  Sensory examination: Soft touch sensation is symmetric on the face, arms, and legs.  Coordination: The patient has good finger-nose-finger and heel-to-shin bilaterally.  Gait and station: The patient has a normal gait. Tandem gait is normal. Romberg is negative. No drift is seen.  Reflexes: Deep tendon reflexes are symmetric.   Assessment/Plan:  1. Intractable seizure disorder  2. Medical noncompliance   The patient appears to be confused on how he is supposed to take  his medications. I have gone over the medication regimen with him, I will set up home health nursing to go into his home and review his medications and set up a regimen for him to keep up with his pills. The patient will return in about 3 weeks to recheck the Dilantin and Lamictal levels. A prescription for Vimpat was given today, the patient is try to get back on the medication. He will follow-up in 3 months.   Jill Alexanders MD 05/08/2016 10:35 AM  [facility] [address] [place], [place] [address]  Phone [phone] Fax [phone]

## 2016-05-09 ENCOUNTER — Telehealth: Payer: Self-pay | Admitting: Neurology

## 2016-05-09 MED ORDER — LACOSAMIDE 150 MG PO TABS: 1.0000 | ORAL_TABLET | Freq: Two times a day (BID) | ORAL | 0 refills | Status: DC

## 2016-05-09 NOTE — Telephone Encounter (Signed)
I'll write a prescription for a 14 day supply of Vimpat until his mail order prescription comes.

## 2016-05-09 NOTE — Addendum Note (Signed)
Addended by: Kathrynn Ducking on: 05/09/2016 04:51 PM   Modules accepted: Orders

## 2016-05-09 NOTE — Telephone Encounter (Addendum)
Eric Fernandez. Spoke with representative. They stated rx in dispensing location. Getting ready to be shipped to pt. Was unable to tell me when will be shipped. Should be this week. She states rx for 14 day urgent supply can be sent to pt local pharmacy if needed.   Received PA request from [facility]. Completed PA online via covermymeds. Key#[id number]. Urgent request. Pending. Awaiting response.    PA Case [id number]. PA vimpat approved effective until 05/10/2016 Questions? Contact [phone].

## 2016-05-09 NOTE — Telephone Encounter (Signed)
Pt calling stating that [facility] of this medication Lacosamide (VIMPAT) 150 MG TABS, pt states he is out of it

## 2016-05-10 NOTE — Telephone Encounter (Signed)
Faxed printed[facility]signed [facility] to [facility] at [phone]. Received confirmation.

## 2016-05-10 NOTE — Telephone Encounter (Signed)
Noted, thank you

## 2016-05-10 NOTE — Telephone Encounter (Signed)
Pt mother called for update re: the Vimpat, I informed her of the fax being sent to the [facility] this morning and that confirmation was received.  She stated she will call them to confirm and then go to get the medication since her son took his last pill this morning.

## 2016-05-17 DIAGNOSIS — G2581 Restless legs syndrome: Secondary | ICD-10-CM | POA: Diagnosis not present

## 2016-05-17 DIAGNOSIS — G4730 Sleep apnea, unspecified: Secondary | ICD-10-CM | POA: Diagnosis not present

## 2016-05-17 DIAGNOSIS — Z7984 Long term (current) use of oral hypoglycemic drugs: Secondary | ICD-10-CM | POA: Diagnosis not present

## 2016-05-17 DIAGNOSIS — F329 Major depressive disorder, single episode, unspecified: Secondary | ICD-10-CM | POA: Diagnosis not present

## 2016-05-17 DIAGNOSIS — F99 Mental disorder, not otherwise specified: Secondary | ICD-10-CM | POA: Diagnosis not present

## 2016-05-17 DIAGNOSIS — G40419 Other generalized epilepsy and epileptic syndromes, intractable, without status epilepticus: Secondary | ICD-10-CM | POA: Diagnosis not present

## 2016-05-17 DIAGNOSIS — F419 Anxiety disorder, unspecified: Secondary | ICD-10-CM | POA: Diagnosis not present

## 2016-05-17 DIAGNOSIS — E119 Type 2 diabetes mellitus without complications: Secondary | ICD-10-CM | POA: Diagnosis not present

## 2016-05-21 ENCOUNTER — Ambulatory Visit: Payer: Medicare HMO | Admitting: Adult Health

## 2016-05-23 ENCOUNTER — Other Ambulatory Visit: Payer: Self-pay

## 2016-05-23 DIAGNOSIS — F99 Mental disorder, not otherwise specified: Secondary | ICD-10-CM | POA: Diagnosis not present

## 2016-05-23 DIAGNOSIS — G40419 Other generalized epilepsy and epileptic syndromes, intractable, without status epilepticus: Secondary | ICD-10-CM | POA: Diagnosis not present

## 2016-05-23 DIAGNOSIS — E119 Type 2 diabetes mellitus without complications: Secondary | ICD-10-CM | POA: Diagnosis not present

## 2016-05-23 DIAGNOSIS — G2581 Restless legs syndrome: Secondary | ICD-10-CM | POA: Diagnosis not present

## 2016-05-23 DIAGNOSIS — G4730 Sleep apnea, unspecified: Secondary | ICD-10-CM | POA: Diagnosis not present

## 2016-05-23 DIAGNOSIS — F419 Anxiety disorder, unspecified: Secondary | ICD-10-CM | POA: Diagnosis not present

## 2016-05-23 DIAGNOSIS — F329 Major depressive disorder, single episode, unspecified: Secondary | ICD-10-CM | POA: Diagnosis not present

## 2016-05-23 DIAGNOSIS — Z7984 Long term (current) use of oral hypoglycemic drugs: Secondary | ICD-10-CM | POA: Diagnosis not present

## 2016-05-23 NOTE — Patient Outreach (Signed)
[facility]) Care Management  05/23/2016  Eric Fernandez Dec 18, 1975 [id number]   Patient has not responded to calls or letter. Will proceed with case closure.  Will notify care management assistant of case closure.   [name], RN, MSN [place] [phone]

## 2016-05-24 DIAGNOSIS — F419 Anxiety disorder, unspecified: Secondary | ICD-10-CM | POA: Diagnosis not present

## 2016-05-24 DIAGNOSIS — G4730 Sleep apnea, unspecified: Secondary | ICD-10-CM | POA: Diagnosis not present

## 2016-05-24 DIAGNOSIS — F329 Major depressive disorder, single episode, unspecified: Secondary | ICD-10-CM | POA: Diagnosis not present

## 2016-05-24 DIAGNOSIS — G40419 Other generalized epilepsy and epileptic syndromes, intractable, without status epilepticus: Secondary | ICD-10-CM | POA: Diagnosis not present

## 2016-05-24 DIAGNOSIS — G2581 Restless legs syndrome: Secondary | ICD-10-CM | POA: Diagnosis not present

## 2016-05-24 DIAGNOSIS — E119 Type 2 diabetes mellitus without complications: Secondary | ICD-10-CM | POA: Diagnosis not present

## 2016-05-24 DIAGNOSIS — F99 Mental disorder, not otherwise specified: Secondary | ICD-10-CM | POA: Diagnosis not present

## 2016-05-24 DIAGNOSIS — Z7984 Long term (current) use of oral hypoglycemic drugs: Secondary | ICD-10-CM | POA: Diagnosis not present

## 2016-05-29 ENCOUNTER — Telehealth: Payer: Self-pay | Admitting: Neurology

## 2016-05-29 DIAGNOSIS — Z5181 Encounter for therapeutic drug level monitoring: Secondary | ICD-10-CM

## 2016-05-29 NOTE — Telephone Encounter (Signed)
-----   Message from Kathrynn Ducking, MD sent at 05/08/2016 10:29 AM EDT ----- Recheck dilantin and lamictal levels

## 2016-05-29 NOTE — Telephone Encounter (Signed)
I called patient. He is to come in to get his Dilantin and Lamictal levels checked in the morning.

## 2016-05-30 ENCOUNTER — Other Ambulatory Visit (INDEPENDENT_AMBULATORY_CARE_PROVIDER_SITE_OTHER): Payer: Self-pay

## 2016-05-30 ENCOUNTER — Encounter (INDEPENDENT_AMBULATORY_CARE_PROVIDER_SITE_OTHER): Payer: Self-pay

## 2016-05-30 DIAGNOSIS — Z0289 Encounter for other administrative examinations: Secondary | ICD-10-CM

## 2016-05-30 DIAGNOSIS — Z5181 Encounter for therapeutic drug level monitoring: Secondary | ICD-10-CM

## 2016-05-31 DIAGNOSIS — G40419 Other generalized epilepsy and epileptic syndromes, intractable, without status epilepticus: Secondary | ICD-10-CM | POA: Diagnosis not present

## 2016-05-31 DIAGNOSIS — F329 Major depressive disorder, single episode, unspecified: Secondary | ICD-10-CM | POA: Diagnosis not present

## 2016-05-31 DIAGNOSIS — G2581 Restless legs syndrome: Secondary | ICD-10-CM | POA: Diagnosis not present

## 2016-05-31 DIAGNOSIS — F419 Anxiety disorder, unspecified: Secondary | ICD-10-CM | POA: Diagnosis not present

## 2016-05-31 DIAGNOSIS — E119 Type 2 diabetes mellitus without complications: Secondary | ICD-10-CM | POA: Diagnosis not present

## 2016-05-31 DIAGNOSIS — G4730 Sleep apnea, unspecified: Secondary | ICD-10-CM | POA: Diagnosis not present

## 2016-05-31 DIAGNOSIS — Z7984 Long term (current) use of oral hypoglycemic drugs: Secondary | ICD-10-CM | POA: Diagnosis not present

## 2016-05-31 DIAGNOSIS — F99 Mental disorder, not otherwise specified: Secondary | ICD-10-CM | POA: Diagnosis not present

## 2016-06-01 DIAGNOSIS — G4733 Obstructive sleep apnea (adult) (pediatric): Secondary | ICD-10-CM | POA: Diagnosis not present

## 2016-06-01 LAB — PHENYTOIN LEVEL, TOTAL: Phenytoin (Dilantin), Serum: 22.9 ug/mL (ref 10.0–20.0)

## 2016-06-01 LAB — LAMOTRIGINE LEVEL: LAMOTRIGINE LVL: 4.4 ug/mL (ref 2.0–20.0)

## 2016-06-02 DIAGNOSIS — E119 Type 2 diabetes mellitus without complications: Secondary | ICD-10-CM | POA: Diagnosis not present

## 2016-06-02 DIAGNOSIS — Z7984 Long term (current) use of oral hypoglycemic drugs: Secondary | ICD-10-CM | POA: Diagnosis not present

## 2016-06-02 DIAGNOSIS — G40419 Other generalized epilepsy and epileptic syndromes, intractable, without status epilepticus: Secondary | ICD-10-CM | POA: Diagnosis not present

## 2016-06-02 DIAGNOSIS — F419 Anxiety disorder, unspecified: Secondary | ICD-10-CM | POA: Diagnosis not present

## 2016-06-02 DIAGNOSIS — G4730 Sleep apnea, unspecified: Secondary | ICD-10-CM | POA: Diagnosis not present

## 2016-06-02 DIAGNOSIS — G2581 Restless legs syndrome: Secondary | ICD-10-CM | POA: Diagnosis not present

## 2016-06-02 DIAGNOSIS — F99 Mental disorder, not otherwise specified: Secondary | ICD-10-CM | POA: Diagnosis not present

## 2016-06-02 DIAGNOSIS — F329 Major depressive disorder, single episode, unspecified: Secondary | ICD-10-CM | POA: Diagnosis not present

## 2016-06-03 ENCOUNTER — Telehealth: Payer: Self-pay | Admitting: Neurology

## 2016-06-03 NOTE — Telephone Encounter (Signed)
I called the patient. The Dilantin level was high, but the patient had taken his dilantin before the blood draw, this is not a trough. Lamictal level is therapeutic. Patient is not to change his dosing.

## 2016-06-05 DIAGNOSIS — Z7984 Long term (current) use of oral hypoglycemic drugs: Secondary | ICD-10-CM | POA: Diagnosis not present

## 2016-06-05 DIAGNOSIS — G40419 Other generalized epilepsy and epileptic syndromes, intractable, without status epilepticus: Secondary | ICD-10-CM | POA: Diagnosis not present

## 2016-06-05 DIAGNOSIS — G4730 Sleep apnea, unspecified: Secondary | ICD-10-CM | POA: Diagnosis not present

## 2016-06-05 DIAGNOSIS — E119 Type 2 diabetes mellitus without complications: Secondary | ICD-10-CM | POA: Diagnosis not present

## 2016-06-05 DIAGNOSIS — F419 Anxiety disorder, unspecified: Secondary | ICD-10-CM | POA: Diagnosis not present

## 2016-06-05 DIAGNOSIS — F329 Major depressive disorder, single episode, unspecified: Secondary | ICD-10-CM | POA: Diagnosis not present

## 2016-06-05 DIAGNOSIS — G2581 Restless legs syndrome: Secondary | ICD-10-CM | POA: Diagnosis not present

## 2016-06-05 DIAGNOSIS — F99 Mental disorder, not otherwise specified: Secondary | ICD-10-CM | POA: Diagnosis not present

## 2016-06-06 ENCOUNTER — Telehealth: Payer: Self-pay | Admitting: Neurology

## 2016-06-06 DIAGNOSIS — G4730 Sleep apnea, unspecified: Secondary | ICD-10-CM | POA: Diagnosis not present

## 2016-06-06 DIAGNOSIS — G40419 Other generalized epilepsy and epileptic syndromes, intractable, without status epilepticus: Secondary | ICD-10-CM | POA: Diagnosis not present

## 2016-06-06 DIAGNOSIS — E119 Type 2 diabetes mellitus without complications: Secondary | ICD-10-CM | POA: Diagnosis not present

## 2016-06-06 DIAGNOSIS — F419 Anxiety disorder, unspecified: Secondary | ICD-10-CM | POA: Diagnosis not present

## 2016-06-06 DIAGNOSIS — F329 Major depressive disorder, single episode, unspecified: Secondary | ICD-10-CM | POA: Diagnosis not present

## 2016-06-06 DIAGNOSIS — F99 Mental disorder, not otherwise specified: Secondary | ICD-10-CM | POA: Diagnosis not present

## 2016-06-06 DIAGNOSIS — G2581 Restless legs syndrome: Secondary | ICD-10-CM | POA: Diagnosis not present

## 2016-06-06 DIAGNOSIS — Z7984 Long term (current) use of oral hypoglycemic drugs: Secondary | ICD-10-CM | POA: Diagnosis not present
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
## 2016-06-06 NOTE — Telephone Encounter (Signed)
Harlan Stains called from [facility]  With an update.  She said that pt was much more calm than when she spoke with him last night.  She feels pt is lonely. Pt took an [place] last night and told her that he slept well.  Caren Griffins stated she fixed his meds for a week, pt will not comply with recommended diet, because of his likes and dislikes.  If need be Caren Griffins can be reached at [phone]
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
## 2016-06-08 DIAGNOSIS — E119 Type 2 diabetes mellitus without complications: Secondary | ICD-10-CM | POA: Diagnosis not present

## 2016-06-08 DIAGNOSIS — G4730 Sleep apnea, unspecified: Secondary | ICD-10-CM | POA: Diagnosis not present

## 2016-06-08 DIAGNOSIS — G40419 Other generalized epilepsy and epileptic syndromes, intractable, without status epilepticus: Secondary | ICD-10-CM | POA: Diagnosis not present

## 2016-06-08 DIAGNOSIS — F99 Mental disorder, not otherwise specified: Secondary | ICD-10-CM | POA: Diagnosis not present

## 2016-06-08 DIAGNOSIS — F329 Major depressive disorder, single episode, unspecified: Secondary | ICD-10-CM | POA: Diagnosis not present

## 2016-06-08 DIAGNOSIS — G2581 Restless legs syndrome: Secondary | ICD-10-CM | POA: Diagnosis not present

## 2016-06-08 DIAGNOSIS — F419 Anxiety disorder, unspecified: Secondary | ICD-10-CM | POA: Diagnosis not present

## 2016-06-08 DIAGNOSIS — Z7984 Long term (current) use of oral hypoglycemic drugs: Secondary | ICD-10-CM | POA: Diagnosis not present

## 2016-06-13 ENCOUNTER — Telehealth: Payer: Self-pay | Admitting: *Deleted

## 2016-06-13 DIAGNOSIS — F329 Major depressive disorder, single episode, unspecified: Secondary | ICD-10-CM | POA: Diagnosis not present

## 2016-06-13 DIAGNOSIS — G2581 Restless legs syndrome: Secondary | ICD-10-CM | POA: Diagnosis not present

## 2016-06-13 DIAGNOSIS — F99 Mental disorder, not otherwise specified: Secondary | ICD-10-CM | POA: Diagnosis not present

## 2016-06-13 DIAGNOSIS — F419 Anxiety disorder, unspecified: Secondary | ICD-10-CM | POA: Diagnosis not present

## 2016-06-13 DIAGNOSIS — G40419 Other generalized epilepsy and epileptic syndromes, intractable, without status epilepticus: Secondary | ICD-10-CM | POA: Diagnosis not present

## 2016-06-13 DIAGNOSIS — Z7984 Long term (current) use of oral hypoglycemic drugs: Secondary | ICD-10-CM | POA: Diagnosis not present

## 2016-06-13 DIAGNOSIS — E119 Type 2 diabetes mellitus without complications: Secondary | ICD-10-CM | POA: Diagnosis not present

## 2016-06-13 DIAGNOSIS — G4730 Sleep apnea, unspecified: Secondary | ICD-10-CM | POA: Diagnosis not present

## 2016-06-13 NOTE — Telephone Encounter (Signed)
[facility] 25mg  tablet. Advised this is old rx. They had cx this out as requested previously but patient requested through automated system.   Patient checked while I was on the phone with him to see if he has refills on medication. He last received refill from [facility] on 06/06/16. Nothing further needed at this time.

## 2016-06-18 DIAGNOSIS — Z6837 Body mass index (BMI) 37.0-37.9, adult: Secondary | ICD-10-CM | POA: Diagnosis not present

## 2016-06-18 DIAGNOSIS — G4733 Obstructive sleep apnea (adult) (pediatric): Secondary | ICD-10-CM | POA: Diagnosis not present

## 2016-06-18 DIAGNOSIS — E1165 Type 2 diabetes mellitus with hyperglycemia: Secondary | ICD-10-CM | POA: Diagnosis not present

## 2016-06-18 DIAGNOSIS — Z23 Encounter for immunization: Secondary | ICD-10-CM | POA: Diagnosis not present

## 2016-06-18 DIAGNOSIS — Z1389 Encounter for screening for other disorder: Secondary | ICD-10-CM | POA: Diagnosis not present

## 2016-06-18 DIAGNOSIS — R569 Unspecified convulsions: Secondary | ICD-10-CM | POA: Diagnosis not present

## 2016-06-18 DIAGNOSIS — E7800 Pure hypercholesterolemia, unspecified: Secondary | ICD-10-CM | POA: Diagnosis not present

## 2016-06-18 DIAGNOSIS — Z0000 Encounter for general adult medical examination without abnormal findings: Secondary | ICD-10-CM | POA: Diagnosis not present

## 2016-06-18 DIAGNOSIS — I10 Essential (primary) hypertension: Secondary | ICD-10-CM | POA: Diagnosis not present

## 2016-06-19 DIAGNOSIS — G2581 Restless legs syndrome: Secondary | ICD-10-CM | POA: Diagnosis not present

## 2016-06-19 DIAGNOSIS — F419 Anxiety disorder, unspecified: Secondary | ICD-10-CM | POA: Diagnosis not present

## 2016-06-19 DIAGNOSIS — Z7984 Long term (current) use of oral hypoglycemic drugs: Secondary | ICD-10-CM | POA: Diagnosis not present

## 2016-06-19 DIAGNOSIS — F99 Mental disorder, not otherwise specified: Secondary | ICD-10-CM | POA: Diagnosis not present

## 2016-06-19 DIAGNOSIS — G40419 Other generalized epilepsy and epileptic syndromes, intractable, without status epilepticus: Secondary | ICD-10-CM | POA: Diagnosis not present

## 2016-06-19 DIAGNOSIS — F329 Major depressive disorder, single episode, unspecified: Secondary | ICD-10-CM | POA: Diagnosis not present

## 2016-06-19 DIAGNOSIS — G4730 Sleep apnea, unspecified: Secondary | ICD-10-CM | POA: Diagnosis not present

## 2016-06-19 DIAGNOSIS — E119 Type 2 diabetes mellitus without complications: Secondary | ICD-10-CM | POA: Diagnosis not present

## 2016-06-26 DIAGNOSIS — F419 Anxiety disorder, unspecified: Secondary | ICD-10-CM | POA: Diagnosis not present

## 2016-06-26 DIAGNOSIS — Z7984 Long term (current) use of oral hypoglycemic drugs: Secondary | ICD-10-CM | POA: Diagnosis not present

## 2016-06-26 DIAGNOSIS — F329 Major depressive disorder, single episode, unspecified: Secondary | ICD-10-CM | POA: Diagnosis not present

## 2016-06-26 DIAGNOSIS — E119 Type 2 diabetes mellitus without complications: Secondary | ICD-10-CM | POA: Diagnosis not present

## 2016-06-26 DIAGNOSIS — G40419 Other generalized epilepsy and epileptic syndromes, intractable, without status epilepticus: Secondary | ICD-10-CM | POA: Diagnosis not present

## 2016-06-26 DIAGNOSIS — G4730 Sleep apnea, unspecified: Secondary | ICD-10-CM | POA: Diagnosis not present

## 2016-06-26 DIAGNOSIS — F99 Mental disorder, not otherwise specified: Secondary | ICD-10-CM | POA: Diagnosis not present

## 2016-06-26 DIAGNOSIS — G2581 Restless legs syndrome: Secondary | ICD-10-CM | POA: Diagnosis not present

## 2016-07-01 DIAGNOSIS — G4733 Obstructive sleep apnea (adult) (pediatric): Secondary | ICD-10-CM | POA: Diagnosis not present

## 2016-07-02 DIAGNOSIS — Z7984 Long term (current) use of oral hypoglycemic drugs: Secondary | ICD-10-CM | POA: Diagnosis not present

## 2016-07-02 DIAGNOSIS — F99 Mental disorder, not otherwise specified: Secondary | ICD-10-CM | POA: Diagnosis not present

## 2016-07-02 DIAGNOSIS — F419 Anxiety disorder, unspecified: Secondary | ICD-10-CM | POA: Diagnosis not present

## 2016-07-02 DIAGNOSIS — G4730 Sleep apnea, unspecified: Secondary | ICD-10-CM | POA: Diagnosis not present

## 2016-07-02 DIAGNOSIS — G40419 Other generalized epilepsy and epileptic syndromes, intractable, without status epilepticus: Secondary | ICD-10-CM | POA: Diagnosis not present

## 2016-07-02 DIAGNOSIS — E119 Type 2 diabetes mellitus without complications: Secondary | ICD-10-CM | POA: Diagnosis not present

## 2016-07-02 DIAGNOSIS — G2581 Restless legs syndrome: Secondary | ICD-10-CM | POA: Diagnosis not present

## 2016-07-02 DIAGNOSIS — F329 Major depressive disorder, single episode, unspecified: Secondary | ICD-10-CM | POA: Diagnosis not present

## 2016-07-11 ENCOUNTER — Emergency Department (HOSPITAL_COMMUNITY)
Admission: EM | Admit: 2016-07-11 | Discharge: 2016-07-11 | Discharge status: Against Medical Advice | Payer: Medicare HMO | Attending: Dermatology | Admitting: Dermatology

## 2016-07-11 ENCOUNTER — Encounter (HOSPITAL_COMMUNITY): Payer: Self-pay

## 2016-07-11 DIAGNOSIS — Z7984 Long term (current) use of oral hypoglycemic drugs: Secondary | ICD-10-CM | POA: Diagnosis not present

## 2016-07-11 DIAGNOSIS — G40419 Other generalized epilepsy and epileptic syndromes, intractable, without status epilepticus: Secondary | ICD-10-CM | POA: Diagnosis not present

## 2016-07-11 DIAGNOSIS — F419 Anxiety disorder, unspecified: Secondary | ICD-10-CM | POA: Diagnosis not present

## 2016-07-11 DIAGNOSIS — G4730 Sleep apnea, unspecified: Secondary | ICD-10-CM | POA: Diagnosis not present

## 2016-07-11 DIAGNOSIS — G2581 Restless legs syndrome: Secondary | ICD-10-CM | POA: Diagnosis not present

## 2016-07-11 DIAGNOSIS — R569 Unspecified convulsions: Secondary | ICD-10-CM

## 2016-07-11 DIAGNOSIS — F329 Major depressive disorder, single episode, unspecified: Secondary | ICD-10-CM | POA: Diagnosis not present

## 2016-07-11 DIAGNOSIS — F99 Mental disorder, not otherwise specified: Secondary | ICD-10-CM | POA: Diagnosis not present

## 2016-07-11 DIAGNOSIS — E119 Type 2 diabetes mellitus without complications: Secondary | ICD-10-CM | POA: Diagnosis not present

## 2016-07-11 NOTE — ED Notes (Addendum)
AFTER VITALS AND EKG WERE OBTAINED PATIENT ASKED IF HE COULD LEAVE AMA. RN NOTIFIED AND SHE WENT TO TALK TO [place]. IV REMOVED AND PATIENT WENT TO LOBBY.

## 2016-07-11 NOTE — ED Notes (Signed)
Bed: RESA Expected date:  Expected time:  Means of arrival:  Comments: EMS seizure 

## 2016-07-11 NOTE — ED Notes (Signed)
EDPA TYLER MADE AWARE OF PT CURRENT STATUS AND REQUEST TO LEAVE. EDPA TYLER HERE AT DESK TO SEE PT AND EVALUATE PT. PT VERBALIZED TO EDPA TYLER HE WAS READY TO LEAVE AND WAS CALLING FOR A RIDE,

## 2016-07-11 NOTE — ED Triage Notes (Signed)
Per [facility]- Witnessed seizure lasting 3 minutes full body. No trauma or urinary incontinent. 10 weeks ago medication change. No seizure until now. HX of have seizures every 4 weeks. Seizure of one seizure daily. No HX of reoccurring seizure. During triage, pt is requesting to leave.Charger TIM RN made aware.

## 2016-07-12 NOTE — ED Provider Notes (Cosign Needed)
Patient presents to the ED with complaints of witnessed seizure. History of same. No trauma or urinary incontinence. Patient requests leave AMA. When I saw the patient he was at the desk with normal gait. Able to speak this provided without difficulties. Calling for a ride. Patient appeared to be in no acute distress. Nurse about patient sign AMA orders. I encouraged patient to stay he refused.   Doristine Devoid, PA-C 07/12/16 0025

## 2016-07-13 NOTE — Addendum Note (Signed)
Addendum  created 07/13/16 0948 by Effie Berkshire, MD   Sign clinical note

## 2016-08-01 DIAGNOSIS — G4733 Obstructive sleep apnea (adult) (pediatric): Secondary | ICD-10-CM | POA: Diagnosis not present

## 2016-08-07 ENCOUNTER — Ambulatory Visit: Payer: Medicare HMO | Admitting: Adult Health

## 2016-08-07 ENCOUNTER — Other Ambulatory Visit: Payer: Self-pay | Admitting: Neurology

## 2016-08-25 ENCOUNTER — Encounter (HOSPITAL_COMMUNITY): Payer: Self-pay | Admitting: *Deleted

## 2016-08-25 ENCOUNTER — Emergency Department (HOSPITAL_COMMUNITY)
Admission: EM | Admit: 2016-08-25 | Discharge: 2016-08-25 | Disposition: A | Discharge status: Home / Self Care | Payer: Medicare HMO | Attending: Physician Assistant | Admitting: Physician Assistant

## 2016-08-25 DIAGNOSIS — I10 Essential (primary) hypertension: Secondary | ICD-10-CM | POA: Diagnosis not present

## 2016-08-25 DIAGNOSIS — Z87891 Personal history of nicotine dependence: Secondary | ICD-10-CM | POA: Diagnosis not present

## 2016-08-25 DIAGNOSIS — E119 Type 2 diabetes mellitus without complications: Secondary | ICD-10-CM | POA: Insufficient documentation

## 2016-08-25 DIAGNOSIS — R569 Unspecified convulsions: Secondary | ICD-10-CM | POA: Diagnosis not present

## 2016-08-25 DIAGNOSIS — Z7984 Long term (current) use of oral hypoglycemic drugs: Secondary | ICD-10-CM | POA: Diagnosis not present

## 2016-08-25 DIAGNOSIS — Z79899 Other long term (current) drug therapy: Secondary | ICD-10-CM | POA: Diagnosis not present

## 2016-08-25 DIAGNOSIS — G40909 Epilepsy, unspecified, not intractable, without status epilepticus: Secondary | ICD-10-CM | POA: Diagnosis not present

## 2016-08-25 NOTE — ED Provider Notes (Signed)
[facility] DEPT Provider Note   CSN: [id number] Arrival date & time: 08/25/16  1620     History   Chief Complaint Chief Complaint  Patient presents with  . Seizures    HPI Eric Fernandez is a 41 y.o. male.  HPI  Patient is a 41 year old male with past medical history extensive for seizures. Patient has a vagal nerve sto,i;atpr/ Patient has not had any seizures lately. However he was out in the the sun today and had a seizure. Patient now feels back to normal.   Past Medical History:  Diagnosis Date  . Anxiety   . Depression   . Diabetes mellitus without complication ([place])   . Hypertension   . Mental disorder    border line bipolar  . Mild obesity   . Restless legs syndrome (RLS) 11/06/2012   Right leg  . Seizures (HCC)    Intractible  . Sleep apnea   . Suicide and self-inflicted injury ([place])   . Vertigo     Patient Active Problem List   Diagnosis Date Noted  . Benign paroxysmal positional vertigo 05/25/2014  . Diabetes mellitus, type 2 ([place]) 11/29/2013  . Restless legs syndrome (RLS) 11/06/2012  . 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 Dysphonia 05/06/2012  . Vocal cord paralysis, unilateral complete 04/11/2011    Past Surgical History:  Procedure Laterality Date  . BURN DEBRIDEMENT SURGERY    . DIRECT LARYNGOSCOPY WITH RADIAESSE INJECTION  04/11/2011   Procedure: DIRECT LARYNGOSCOPY WITH RADIAESSE INJECTION;  Surgeon: Melida Quitter, MD;  Location: [facility];  Service: ENT;  Laterality: N/A;  MICRO DIRECT LARYNGOSCOPY WITH LEFT VOCAL FOLD RADIESSE INJECTION/JET VENTURI VENTILATION  . JOINT REPLACEMENT     rotator cuff surgery   lt  . SHOULDER SURGERY N/A   . VAGUS NERVE STIMULATOR INSERTION    . VAGUS NERVE STIMULATOR INSERTION Left 03/13/2016   Procedure: REPLACEMENT OF VAGAL NERVE STIMULATOR BATTERY;  Surgeon: Consuella Lose, MD;  Location: [facility];  Service: Neurosurgery;  Laterality: Left;  REPLACEMENT OF VAGAL NERVE  STIMULATOR BATTERY       Home Medications    Prior to Admission medications   Medication Sig Start Date End Date Taking? Authorizing Provider  amLODipine (NORVASC) 5 MG tablet TAKE ONE TABLET BY MOUTH DAILY 04/12/14   Kathrynn Ducking, MD  cholecalciferol (VITAMIN D) 1000 units tablet TAKE 1 TABLET TWICE DAILY 03/07/16   Kathrynn Ducking, MD  DILANTIN 100 MG ER capsule Take 4 capsules in the morning and 3 in the evening 05/08/16   Kathrynn Ducking, MD  escitalopram (LEXAPRO) 20 MG tablet Take 1 tablet (20 mg total) by mouth daily. 02/16/16   Kathrynn Ducking, MD  FARXIGA 10 MG TABS tablet Take 10 mg by mouth daily.  09/08/15   [provider]  HYDROcodone-acetaminophen (NORCO/VICODIN) 5-325 MG tablet Take 1 tablet by mouth every 6 (six) hours as needed for moderate pain. 03/05/16   Kathrynn Ducking, MD  ibuprofen (ADVIL,MOTRIN) 800 MG tablet Take 800 mg by mouth every 8 (eight) hours as needed (for pain).    [provider]  JANUVIA 100 MG tablet Take 100 mg by mouth daily.  08/26/15   [provider]  Lacosamide (VIMPAT) 150 MG TABS Take 1 tablet (150 mg total) by mouth 2 (two) times daily. 05/09/16   Kathrynn Ducking, MD  lamoTRIgine (LAMICTAL) 100 MG tablet Take 3 tablets (300 mg total) by mouth 2 (two) times daily. 03/21/16  Kathrynn Ducking, MD  lisinopril (PRINIVIL,ZESTRIL) 10 MG tablet TAKE 1 TABLET EVERY DAY 08/07/16   Kathrynn Ducking, MD  metFORMIN (GLUCOPHAGE) 500 MG tablet Take 1,000 mg by mouth 2 (two) times daily with a meal.  11/23/13   [provider]  pramipexole (MIRAPEX) 1 MG tablet Take 1 tablet (1 mg total) by mouth at bedtime. 01/31/16   Kathrynn Ducking, MD  pravastatin (PRAVACHOL) 40 MG tablet Take 40 mg by mouth at bedtime.     [provider]  zolpidem (AMBIEN) 10 MG tablet TAKE 1 TABLET AT BEDTIME AS NEEDED FOR SLEEP 10/10/15   Kathrynn Ducking, MD    Family History Family History  Problem Relation Age of Onset  .  Diabetes Mother   . ALS Maternal Aunt   . Cancer Maternal Uncle     Social History Social History  Substance Use Topics  . Smoking status: Former Research scientist (life sciences)  . Smokeless tobacco: Never Used     Comment: Quit when he was 41 years old  . Alcohol use No     Allergies   No known allergies   Review of Systems Review of Systems  Constitutional: Negative for activity change.  Respiratory: Negative for shortness of breath.   Cardiovascular: Negative for chest pain.  Gastrointestinal: Negative for abdominal pain.  Neurological: Positive for seizures.     Physical Exam Updated Vital Signs There were no vitals taken for this visit.  Physical Exam  Constitutional: He is oriented to person, place, and time. He appears well-nourished.  No signs of trauma.  HENT:  Head: Normocephalic.  Eyes: Conjunctivae are normal.  Cardiovascular: Normal rate.   Pulmonary/Chest: Effort normal and breath sounds normal.  Abdominal: Soft. He exhibits no distension. There is no tenderness.  Neurological: He is oriented to person, place, and time.  Skin: Skin is warm and dry. He is not diaphoretic.  Psychiatric: He has a normal mood and affect. His behavior is normal.     ED Treatments / Results  Labs (all labs ordered are listed, but only abnormal results are displayed) Labs Reviewed  CBG MONITORING, ED    EKG  EKG Interpretation None       Radiology No results found.  Procedures Procedures (including critical care time)  Medications Ordered in ED Medications - No data to display   Initial Impression / Assessment and Plan / ED Course  I have reviewed the triage vital signs and the nursing notes.  Pertinent labs & imaging results that were available during my care of the patient were reviewed by me and considered in my medical decision making (see chart for details).     Patient is a 41 year old male with past medical history significant for seizures. He had a seizure today.  We'll have him follow-up with his primary care physician and whoever manages his seizures.[place] need for imaging or lab work given extensive history for seizures. Patient's mother is here and agrees he does not need workup.  Final Clinical Impressions(s) / ED Diagnoses   Final diagnoses:  Seizure [facility])    New Prescriptions New Prescriptions   No medications on file     Macarthur Critchley, MD 08/25/16 1655

## 2016-08-25 NOTE — ED Notes (Signed)
He declines any further treatment and states "I'm just going to sign out". He is oriented x 4 with clear speech.

## 2016-08-25 NOTE — ED Triage Notes (Signed)
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 2.5 mg Versed given. IV # 22 rt forearm, abrasion to eyebrow. LSB and C collar due to injury, 128/79-97-98% CBG 167

## 2016-08-31 ENCOUNTER — Emergency Department (HOSPITAL_COMMUNITY)
Admission: EM | Admit: 2016-08-31 | Discharge: 2016-08-31 | Disposition: A | Discharge status: Home / Self Care | Payer: Medicare HMO | Attending: Emergency Medicine | Admitting: Emergency Medicine

## 2016-08-31 DIAGNOSIS — E119 Type 2 diabetes mellitus without complications: Secondary | ICD-10-CM | POA: Insufficient documentation

## 2016-08-31 DIAGNOSIS — Z7984 Long term (current) use of oral hypoglycemic drugs: Secondary | ICD-10-CM | POA: Insufficient documentation

## 2016-08-31 DIAGNOSIS — G4733 Obstructive sleep apnea (adult) (pediatric): Secondary | ICD-10-CM | POA: Diagnosis not present

## 2016-08-31 DIAGNOSIS — Z87891 Personal history of nicotine dependence: Secondary | ICD-10-CM | POA: Diagnosis not present

## 2016-08-31 DIAGNOSIS — Z79899 Other long term (current) drug therapy: Secondary | ICD-10-CM | POA: Diagnosis not present

## 2016-08-31 DIAGNOSIS — R569 Unspecified convulsions: Secondary | ICD-10-CM | POA: Insufficient documentation

## 2016-08-31 NOTE — ED Provider Notes (Signed)
[facility] DEPT Provider Note   CSN: [id number] Arrival date & time: 08/31/16  1746  By signing my name below, I, Reola Mosher, attest that this documentation has been prepared under the direction and in the presence of Virgel Manifold, MD. Electronically Signed: Reola Mosher, ED Scribe. 08/31/16. 6:49 PM.  History   Chief Complaint Chief Complaint  Patient presents with  . Seizures   The history is provided by the patient. No language interpreter was used.    HPI Comments: Eric Fernandez is a 41 y.o. male with a h/o DM2, depression, anxiety, HTN, seizures, who presents to the Emergency Department s/p witnessed seizure which occurred prior to arrival. Pt reports that when he returned home from the store today that he was focusing on an item that he had bought and began to experience seizure-like activity. He does have a h/o seizures and reports that this is not abnormal for him. His neighbors witnessed the event and called EMS. He is currently prescribed Dilantin for his seizure history and he reports that he has some compliance issues with this. While in the ED he states that he is feeling towards his baseline and otherwise normal. No illicit drug usage or alcohol use. He denies sleep disturbance, chest pain, shortness of breath, or any other associated symptoms.   Past Medical History:  Diagnosis Date  . Anxiety   . Depression   . Diabetes mellitus without complication ([place])   . Hypertension   . Mental disorder    border line bipolar  . Mild obesity   . Restless legs syndrome (RLS) 11/06/2012   Right leg  . Seizures (HCC)    Intractible  . Sleep apnea   . Suicide and self-inflicted injury ([place])   . Vertigo    Patient Active Problem List   Diagnosis Date Noted  . Benign paroxysmal positional vertigo 05/25/2014  . Diabetes mellitus, type 2 ([place]) 11/29/2013  . Restless legs syndrome (RLS) 11/06/2012  . Generalized convulsive epilepsy with intractable epilepsy  ([place]) 05/06/2012  . Dysphonia 05/06/2012  . Vocal cord paralysis, unilateral complete 04/11/2011   Past Surgical History:  Procedure Laterality Date  . BURN DEBRIDEMENT SURGERY    . DIRECT LARYNGOSCOPY WITH RADIAESSE INJECTION  04/11/2011   Procedure: DIRECT LARYNGOSCOPY WITH RADIAESSE INJECTION;  Surgeon: Melida Quitter, MD;  Location: [facility];  Service: ENT;  Laterality: N/A;  MICRO DIRECT LARYNGOSCOPY WITH LEFT VOCAL FOLD RADIESSE INJECTION/JET VENTURI VENTILATION  . JOINT REPLACEMENT     rotator cuff surgery   lt  . SHOULDER SURGERY N/A   . VAGUS NERVE STIMULATOR INSERTION    . VAGUS NERVE STIMULATOR INSERTION Left 03/13/2016   Procedure: REPLACEMENT OF VAGAL NERVE STIMULATOR BATTERY;  Surgeon: Consuella Lose, MD;  Location: [facility];  Service: Neurosurgery;  Laterality: Left;  REPLACEMENT OF VAGAL NERVE STIMULATOR BATTERY    Home Medications    Prior to Admission medications   Medication Sig Start Date End Date Taking? Authorizing Provider  amLODipine (NORVASC) 5 MG tablet TAKE ONE TABLET BY MOUTH DAILY 04/12/14   Kathrynn Ducking, MD  cholecalciferol (VITAMIN D) 1000 units tablet TAKE 1 TABLET TWICE DAILY 03/07/16   Kathrynn Ducking, MD  DILANTIN 100 MG ER capsule Take 4 capsules in the morning and 3 in the evening 05/08/16   Kathrynn Ducking, MD  escitalopram (LEXAPRO) 20 MG tablet Take 1 tablet (20 mg total) by mouth daily. 02/16/16   Kathrynn Ducking, MD  FARXIGA 10 MG TABS tablet  Take 10 mg by mouth daily.  09/08/15   [provider]  HYDROcodone-acetaminophen (NORCO/VICODIN) 5-325 MG tablet Take 1 tablet by mouth every 6 (six) hours as needed for moderate pain. 03/05/16   Kathrynn Ducking, MD  ibuprofen (ADVIL,MOTRIN) 800 MG tablet Take 800 mg by mouth every 8 (eight) hours as needed (for pain).    [provider]  JANUVIA 100 MG tablet Take 100 mg by mouth daily.  08/26/15   [provider]  Lacosamide (VIMPAT) 150 MG TABS Take 1 tablet (150 mg total) by  mouth 2 (two) times daily. 05/09/16   Kathrynn Ducking, MD  lamoTRIgine (LAMICTAL) 100 MG tablet Take 3 tablets (300 mg total) by mouth 2 (two) times daily. 03/21/16   Kathrynn Ducking, MD  lamoTRIgine (LAMICTAL) 25 MG tablet  06/04/16   [provider]  lisinopril (PRINIVIL,ZESTRIL) 10 MG tablet TAKE 1 TABLET EVERY DAY 08/07/16   Kathrynn Ducking, MD  metFORMIN (GLUCOPHAGE) 500 MG tablet Take 1,000 mg by mouth 2 (two) times daily with a meal.  11/23/13   [provider]  phenytoin (DILANTIN) 50 MG tablet  08/11/16   [provider]  pramipexole (MIRAPEX) 1 MG tablet Take 1 tablet (1 mg total) by mouth at bedtime. 01/31/16   Kathrynn Ducking, MD  pravastatin (PRAVACHOL) 40 MG tablet Take 40 mg by mouth at bedtime.     [provider]  zolpidem (AMBIEN) 10 MG tablet TAKE 1 TABLET AT BEDTIME AS NEEDED FOR SLEEP 10/10/15   Kathrynn Ducking, MD   Family History Family History  Problem Relation Age of Onset  . Diabetes Mother   . ALS Maternal Aunt   . Cancer Maternal Uncle    Social History Social History  Substance Use Topics  . Smoking status: Former Research scientist (life sciences)  . Smokeless tobacco: Never Used     Comment: Quit when he was 41 years old  . Alcohol use No   Allergies   No known allergies  Review of Systems Review of Systems  Respiratory: Negative for shortness of breath.   Cardiovascular: Negative for chest pain.  Neurological: Positive for seizures.  Psychiatric/Behavioral: Negative for sleep disturbance.  All other systems reviewed and are negative.  Physical Exam Updated Vital Signs BP 132/84   Pulse 97   Temp 97.9 F (36.6 C) (Oral)   Resp (!) 27   SpO2 97%   Physical Exam  Constitutional: He appears well-developed and well-nourished.  HENT:  Head: Normocephalic.  Right Ear: External ear normal.  Left Ear: External ear normal.  Nose: Nose normal.  Eyes: Conjunctivae are normal. Right eye exhibits no discharge. Left eye exhibits no  discharge.  Neck: Normal range of motion.  Cardiovascular: Normal rate, regular rhythm and normal heart sounds.   No murmur heard. Pulmonary/Chest: Effort normal and breath sounds normal. No respiratory distress. He has no wheezes. He has no rales.  Abdominal: Soft. There is no tenderness. There is no rebound and no guarding.  Musculoskeletal: Normal range of motion. He exhibits no edema or tenderness.  Neurological: He is alert. No cranial nerve deficit. Coordination normal.  Skin: Skin is warm and dry. No rash noted. No erythema. No pallor.  Psychiatric: He has a normal mood and affect. His behavior is normal.  Nursing note and vitals reviewed.  ED Treatments / Results  DIAGNOSTIC STUDIES: Oxygen Saturation is 99% on RA, normal by my interpretation.   COORDINATION OF CARE: 6:44 PM-Discussed next steps with pt. Pt  verbalized understanding and is agreeable with the plan.   Labs (all labs ordered are listed, but only abnormal results are displayed) Labs Reviewed - No data to display  EKG  EKG Interpretation None      Radiology No results found.  Procedures Procedures   Medications Ordered in ED Medications - No data to display  Initial Impression / Assessment and Plan / ED Course  I have reviewed the triage vital signs and the nursing notes.  Pertinent labs & imaging results that were available during my care of the patient were reviewed by me and considered in my medical decision making (see chart for details).     41yM with seizure like activity. Hx of seizure. Now back to baseline. Wants to go home. Afebrile. Nonfocal neuro exam. I don't feel strongly that he needs emergent testing. It has been determined that no acute conditions requiring further emergency intervention are present at this time. The patient has been advised of the diagnosis and plan. I reviewed any labs and imaging including any potential incidental findings. We have discussed signs and symptoms that  warrant return to the ED and they are listed in the discharge instructions.    Final Clinical Impressions(s) / ED Diagnoses   Final diagnoses:  Seizure [facility])   New Prescriptions New Prescriptions   No medications on file   I personally preformed the services scribed in my presence. The recorded information has been reviewed is accurate. Virgel Manifold, MD.    Virgel Manifold, MD 09/10/16 1728

## 2016-08-31 NOTE — ED Triage Notes (Signed)
Per EMS, pt is coming from home after neighbors called EMS stating pt was having a seizure in his yard. Pt denies any pain and just reports being tired. No incontinence noted. Hx of seizures.

## 2016-08-31 NOTE — ED Notes (Signed)
Pt stated that he wanted to go home. He had no intentions of coming here b/c when he has seizures at home he just goes to sleep. Pt wants to know if he can sign himself out.

## 2016-09-03 ENCOUNTER — Other Ambulatory Visit: Payer: Self-pay

## 2016-09-03 NOTE — Patient Outreach (Signed)
[facility]) Care Management  09/03/2016  Eric Fernandez 11/17/1975 [id number]   Telephone call to patient for ED Utilization Screening.  Patient able to verify HIPAA.  Patient reports that he is doing fine.  Discussed [facility] Care Management Services.  Patient declines services at this time.    Plan: RN Health Coach will send letter and brochure to patient.   RN Health Coach will notify care management assistant of case status.   [name], RN, MSN [place] [phone]

## 2016-09-29 ENCOUNTER — Other Ambulatory Visit: Payer: Self-pay | Admitting: Neurology

## 2016-10-01 DIAGNOSIS — G4733 Obstructive sleep apnea (adult) (pediatric): Secondary | ICD-10-CM | POA: Diagnosis not present

## 2016-10-02 NOTE — Telephone Encounter (Signed)
Faxed printed/signed rx zolpidem to [facility]. Fax: [phone]. Received confirmation.

## 2016-10-26 ENCOUNTER — Emergency Department (HOSPITAL_COMMUNITY)
Admission: EM | Admit: 2016-10-26 | Discharge: 2016-10-26 | Discharge status: Against Medical Advice | Payer: Medicare HMO | Attending: Emergency Medicine | Admitting: Emergency Medicine

## 2016-10-26 DIAGNOSIS — Z5321 Procedure and treatment not carried out due to patient leaving prior to being seen by health care provider: Secondary | ICD-10-CM

## 2016-10-26 DIAGNOSIS — R569 Unspecified convulsions: Secondary | ICD-10-CM | POA: Diagnosis not present

## 2016-10-26 NOTE — ED Provider Notes (Signed)
Pt LWBS while being called back to room. I was unable to evaluate this patient   Duffy Bruce, MD 10/26/16 2342

## 2016-10-26 NOTE — ED Notes (Signed)
Patient refusing vital signs or any other triage assessment. Patient reports that his mother is on her way to come get him and he wants to sign out AMA.  Patient signed [facility] electronic form, and walked to ED lobby.  Patient A&Ox4, able to ambulate with steady gait.

## 2016-10-26 NOTE — ED Notes (Signed)
Bed: XB26 Expected date:  Expected time:  Means of arrival:  Comments: Seizure

## 2016-10-26 NOTE — ED Notes (Signed)
Pt does not want to be seen my MD. Is not seeking medical assistance. Neighbors called.

## 2016-10-26 NOTE — ED Triage Notes (Signed)
Per [facility] patient from home after having seizure that was witnessed by neighbors who called EMS.  Patient wanting to leave per EMS, because he telling them that he knows he doesn't need to be here.

## 2016-12-04 ENCOUNTER — Other Ambulatory Visit: Payer: Self-pay | Admitting: Neurology

## 2016-12-14 ENCOUNTER — Telehealth: Payer: Self-pay | Admitting: Neurology

## 2016-12-14 MED ORDER — PREDNISONE 5 MG PO TABS: ORAL_TABLET | ORAL | 0 refills | Status: DC

## 2016-12-14 MED ORDER — METHOCARBAMOL 500 MG PO TABS: 500.0000 mg | ORAL_TABLET | Freq: Three times a day (TID) | ORAL | 1 refills | Status: AC

## 2016-12-14 NOTE — Telephone Encounter (Signed)
Pt calling re; a sezieur he had over a week ago.  Pt states he feels like he ha messed up his neck and is causing him extreme pain.  He would like to be able to come in before January, he states he doesn't want to wait that long.  Pt was asked if he has gone to ED he said no and will not go unless he has no other options.  Please call

## 2016-12-14 NOTE — Telephone Encounter (Signed)
I called the patient.  Patient had a seizure about 2 or 2 and half weeks ago and fell off a porch, since that time he has had some pain in the right neck that occurs when turning his head to the right or with riding his bike.  He has pain that goes down the right arm to the elbow.  He denies any weakness of the arms.  He does not have pain all the time.  I will give him a 5 mg 6-day Dosepak of prednisone, we will place him on methocarbamol.  If he is no better in 7-10 days, he is to contact our office, we will see him for evaluation at that time.

## 2016-12-14 NOTE — Addendum Note (Signed)
Addended by: Kathrynn Ducking on: 12/14/2016 02:04 PM   Modules accepted: Orders

## 2016-12-21 NOTE — Telephone Encounter (Signed)
Pt called said the medications have not helped at all. Pt said neighbor gave him hydrocodone/actephemanine 2 tablets and pain level went from 10 to 7. He is not asking for pain medication just reporting it. Pt does not want to go to ED, trying to deal with it on a daily basis. An appt was ok'd by Dr Viona Gilmore per Terrence Dupont for 11/13

## 2016-12-25 ENCOUNTER — Ambulatory Visit (INDEPENDENT_AMBULATORY_CARE_PROVIDER_SITE_OTHER): Payer: Medicare HMO | Admitting: Neurology

## 2016-12-25 ENCOUNTER — Encounter (INDEPENDENT_AMBULATORY_CARE_PROVIDER_SITE_OTHER): Payer: Self-pay

## 2016-12-25 ENCOUNTER — Encounter: Payer: Self-pay | Admitting: Neurology

## 2016-12-25 VITALS — BP 138/76 | HR 91 | Ht 68.0 in

## 2016-12-25 DIAGNOSIS — M5412 Radiculopathy, cervical region: Secondary | ICD-10-CM | POA: Diagnosis not present

## 2016-12-25 DIAGNOSIS — Z5181 Encounter for therapeutic drug level monitoring: Secondary | ICD-10-CM

## 2016-12-25 MED ORDER — IBUPROFEN 800 MG PO TABS: 800.0000 mg | ORAL_TABLET | Freq: Three times a day (TID) | ORAL | 1 refills | Status: AC | PRN

## 2016-12-25 MED ORDER — HYDROCODONE-ACETAMINOPHEN 5-325 MG PO TABS: 1.0000 | ORAL_TABLET | Freq: Four times a day (QID) | ORAL | 0 refills | Status: DC | PRN

## 2016-12-25 NOTE — Patient Instructions (Signed)
   We will get MRI of the cervical spine. 

## 2016-12-25 NOTE — Progress Notes (Signed)
Reason for visit: Seizures, neck pain  Eric Fernandez is an 41 y.o. male  History of present illness:  Eric Fernandez is a 41 year old right-handed white male with a history of intractable seizures.  The patient has a vagal nerve stimulator in place.  The patient has been in the emergency room on 30 May, 14th and 20 July and 14 September for seizures.  The patient had a seizure around 23 November 2016 and he fell off of his porch.  Since that time, he has had neck pain and right upper arm and shoulder pain when he turns his head to the right and looks up slightly.  He finds that when he rides his scooter this results in pain, has no pain if he looks straight or to the left.  He denies any weakness of the extremities.  He was placed on a prednisone Dosepak and was given a trial on methocarbamol without much benefit.  He continues to have the discomfort, slightly worsening over time.  The patient denies any balance issues or troubles controlling the bowels with the bladder.  He comes to this office for evaluation of the neck discomfort.  Past Medical History:  Diagnosis Date  . Anxiety   . Depression   . Diabetes mellitus without complication ([place])   . Hypertension   . Mental disorder    border line bipolar  . Mild obesity   . Restless legs syndrome (RLS) 11/06/2012   Right leg  . Seizures (HCC)    Intractible  . Sleep apnea   . Suicide and self-inflicted injury ([place])   . Vertigo     Past Surgical History:  Procedure Laterality Date  . BURN DEBRIDEMENT SURGERY    . JOINT REPLACEMENT     rotator cuff surgery   lt  . SHOULDER SURGERY N/A   . VAGUS NERVE STIMULATOR INSERTION      Family History  Problem Relation Age of Onset  . Diabetes Mother   . ALS Maternal Aunt   . Cancer Maternal Uncle     Social history:  reports that he has quit smoking. he has never used smokeless tobacco. He reports that he does not drink alcohol or use drugs.    Allergies  Allergen Reactions  . No  Known Allergies     Medications:  Prior to Admission medications   Medication Sig Start Date End Date Taking? Authorizing Provider  amLODipine (NORVASC) 5 MG tablet TAKE ONE TABLET BY MOUTH DAILY 04/12/14  Yes Kathrynn Ducking, MD  DILANTIN 100 MG ER capsule Take 4 capsules in the morning and 3 in the evening 05/08/16  Yes Kathrynn Ducking, MD  escitalopram (LEXAPRO) 20 MG tablet Take 1 tablet (20 mg total) by mouth daily. 02/16/16  Yes Kathrynn Ducking, MD  FARXIGA 10 MG TABS tablet Take 10 mg by mouth daily.  09/08/15  Yes [provider]  lisinopril (PRINIVIL,ZESTRIL) 10 MG tablet TAKE 1 TABLET EVERY DAY Patient taking differently: TAKE 1 TABLET EVERY MORNING 08/07/16  Yes Kathrynn Ducking, MD  metFORMIN (GLUCOPHAGE) 500 MG tablet Take 1,000 mg by mouth 2 (two) times daily with a meal.  11/23/13  Yes [provider]  phenytoin (DILANTIN) 50 MG tablet Chew 50 mg every morning by mouth.  08/11/16  Yes [provider]  pramipexole (MIRAPEX) 1 MG tablet TAKE 1 TABLET AT BEDTIME 12/05/16  Yes Kathrynn Ducking, MD  pravastatin (PRAVACHOL) 40 MG tablet Take 40 mg by mouth at  bedtime.    Yes [provider]  cholecalciferol (VITAMIN D) 1000 units tablet TAKE 1 TABLET TWICE DAILY 03/07/16   Kathrynn Ducking, MD  HYDROcodone-acetaminophen (NORCO/VICODIN) 5-325 MG tablet Take 1 tablet by mouth every 6 (six) hours as needed for moderate pain. 03/05/16   Kathrynn Ducking, MD  ibuprofen (ADVIL,MOTRIN) 800 MG tablet Take 800 mg by mouth every 8 (eight) hours as needed (for pain).    [provider]  JANUVIA 100 MG tablet Take 100 mg by mouth daily.  08/26/15   [provider]  Lacosamide (VIMPAT) 150 MG TABS Take 1 tablet (150 mg total) by mouth 2 (two) times daily. 05/09/16   Kathrynn Ducking, MD  lamoTRIgine (LAMICTAL) 100 MG tablet Take 3 tablets (300 mg total) by mouth 2 (two) times daily. 03/21/16   Kathrynn Ducking, MD  meclizine (ANTIVERT) 25 MG  tablet TAKE 1 TABLET THREE TIMES DAILY AS NEEDED FOR DIZZINESS 10/01/16   Kathrynn Ducking, MD  methocarbamol (ROBAXIN) 500 MG tablet Take 1 tablet (500 mg total) by mouth 3 (three) times daily. 12/14/16   Kathrynn Ducking, MD  predniSONE (DELTASONE) 5 MG tablet Begin taking 6 tablets daily, taper by one tablet daily until off the medication. 12/14/16   Kathrynn Ducking, MD  zolpidem (AMBIEN) 10 MG tablet TAKE 1 TABLET AT BEDTIME AS NEEDED FOR SLEEP 10/01/16   Kathrynn Ducking, MD    ROS:  Out of a complete 14 system review of symptoms, the patient complains only of the following symptoms, and all other reviewed systems are negative.  Restless legs Neck pain Confusion, depression Memory loss, seizure  Blood pressure 138/76, pulse 91.  Physical Exam  General: The patient is alert and cooperative at the time of the examination.  Neuromuscular: Range of movement of the cervical spine lacks about 15 degrees looking to the right, fairly normal when looking to the left.  Skin: No significant peripheral edema is noted.   Neurologic Exam  Mental status: The patient is alert and oriented x 3 at the time of the examination. The patient has apparent normal recent and remote memory, with an apparently normal attention span and concentration ability.   Cranial nerves: Facial symmetry is present. Speech is normal, no aphasia or dysarthria is noted. Extraocular movements are full. Visual fields are full.  Motor: The patient has good strength in all 4 extremities.  Sensory examination: Soft touch sensation is symmetric on the face, arms, and legs.  Coordination: The patient has good finger-nose-finger and heel-to-shin bilaterally.  Gait and station: The patient has a normal gait. Tandem gait is normal. Romberg is negative. No drift is seen.  Reflexes: Deep tendon reflexes are symmetric.   Assessment/Plan:  1.  Neck pain, new onset following fall  2.  Intractable seizures  3.  Vagal  nerve stimulator placement  The patient will be sent for MRI of the cervical spine, he has neck pain and shoulder and arm pain that seem to follow the C5 nerve root distribution.  The patient likely has impingement of the C5 nerve root but no injury to the nerve.  The patient may benefit from an epidural steroid injection in the future.  He will come to our office just prior to the MRI to have the vagal nerve stimulator turned off, and then turned back on after the study.  The patient will have blood work done today looking at blood levels of his seizure medications.  The patient will  follow up for his next scheduled visit in January 2019.  Jill Alexanders MD 12/25/2016 11:00 AM  [facility] [address] [place], [place] [address]  Phone [phone] Fax [phone]

## 2016-12-26 ENCOUNTER — Other Ambulatory Visit: Payer: Self-pay | Admitting: Neurology

## 2017-01-05 ENCOUNTER — Other Ambulatory Visit: Payer: Self-pay | Admitting: Neurology

## 2017-01-07 ENCOUNTER — Telehealth: Payer: Self-pay | Admitting: *Deleted

## 2017-01-07 ENCOUNTER — Other Ambulatory Visit: Payer: Self-pay | Admitting: Neurology

## 2017-01-07 MED ORDER — HYDROCODONE-ACETAMINOPHEN 5-325 MG PO TABS: 1.0000 | ORAL_TABLET | Freq: Four times a day (QID) | ORAL | 0 refills | Status: DC | PRN

## 2017-01-07 NOTE — Telephone Encounter (Signed)
Dr Jannifer Franklin refilled per pt request and brought to pt.

## 2017-01-07 NOTE — Telephone Encounter (Signed)
Patient stopped by to request a RX.  Needs Hydrocodone-Acetamin 5-325 [facility].  Wants to wait for it.

## 2017-01-07 NOTE — Telephone Encounter (Signed)
[facility] states since patient has, VNS, they do not complete MRI at [facility] imaging with VNS. May have to go to hospital to have MRI completed.  Phone number: Denton Ar needs call back to confirm what next steps should be. Her phone: [phone]

## 2017-01-07 NOTE — Telephone Encounter (Signed)
Faxed printed/signed rx Lamictal to [facility] at [phone]. Received fax confirmation.

## 2017-01-08 ENCOUNTER — Inpatient Hospital Stay: Admission: RE | Admit: 2017-01-08 | Payer: Medicare HMO | Source: Ambulatory Visit

## 2017-01-08 NOTE — Telephone Encounter (Signed)
As I told the patient, I will turn off the vagal nerve stimulator prior to the scan and turned back on after the scan.  I have done this before.  The scans have been done previously a [facility] imaging.

## 2017-01-09 NOTE — Telephone Encounter (Signed)
Called and LVM again for Brianna to call and discuss pt.

## 2017-01-09 NOTE — Telephone Encounter (Signed)
Called and LVM for Brianna to call back and discuss getting pt scheduled for MRI. Gave [facility] phone number for call back.

## 2017-01-10 NOTE — Telephone Encounter (Signed)
Denton Ar returned my call. She stated they cannot complete MRI at [facility] imaging. It is not safe since he has VNS. I did advise that Dr Jannifer Franklin will turn off prior to study and have pt come back after and have it turned back on.  She again said she verified with Tabitha that they cannot have MRI done there. Pt can have CT scan. Advised I will send message to CW,MD to see how he wants to proceed.

## 2017-02-18 ENCOUNTER — Ambulatory Visit: Payer: Medicare HMO | Admitting: Neurology

## 2017-03-04 ENCOUNTER — Telehealth: Payer: Self-pay | Admitting: Neurology

## 2017-03-04 NOTE — Telephone Encounter (Signed)
I called the patient.  The patient indicates that he has the flu, antibiotics generally do not help this, he wanted an antibiotic called in.  He has a cough, I have recommended products with dextromethorphan, he probably should not be on a medication with an antihistamine, he is to talk with the pharmacist before he purchases an over-the-counter product.

## 2017-03-04 NOTE — Telephone Encounter (Signed)
Pt thinks he has caught the flu from a friend and is wanting to know if Dr Jannifer Franklin will prescribe him anything. Pt is saying that PCP cant give him anything. Please call to discuss

## 2017-03-05 DIAGNOSIS — R062 Wheezing: Secondary | ICD-10-CM | POA: Diagnosis not present

## 2017-03-11 ENCOUNTER — Other Ambulatory Visit: Payer: Self-pay | Admitting: Neurology

## 2017-03-12 ENCOUNTER — Other Ambulatory Visit: Payer: Self-pay

## 2017-03-12 MED ORDER — ESCITALOPRAM OXALATE 20 MG PO TABS: 20.0000 mg | ORAL_TABLET | Freq: Every day | ORAL | 0 refills | Status: DC

## 2017-03-12 MED ORDER — DILANTIN 100 MG PO CAPS: ORAL_CAPSULE | ORAL | 0 refills | Status: DC

## 2017-04-16 ENCOUNTER — Telehealth: Payer: Self-pay | Admitting: Neurology

## 2017-04-16 NOTE — Telephone Encounter (Signed)
Pt's mother called she said he had a seizure about 3 or 4 days ago. He vomited but did not bite his tongue. She was at her home and was able to go and help him.  FYI She also wants to know if the pt would be a candidate for hemp oil or the pill form to help with his seizures. She is thinking his insurance will pay for it.  She states she is taking it for her over active bladder and it has helped tremendously. Please call to advise

## 2017-04-16 NOTE — Telephone Encounter (Signed)
I called the patient.  I talk with the mother.  The patient did have a seizure 3-4 days ago.  He still has generalized seizures that are less frequent, but still has "phase out" events.  The patient may try hemp oil if he decides that this is what he wants to do.  I have no problem with this.  The patient does not have an appointment scheduled in our office, will get one set up.

## 2017-04-16 NOTE — Telephone Encounter (Signed)
Tried calling mother back to set up f/u appt.  Can offer EMG slot on 04/30/17 if they call back.

## 2017-04-17 NOTE — Telephone Encounter (Signed)
Called pt. Scheduled appt for 04/30/17 at 11am, check in no later than 10:45am. He verbalized understanding.

## 2017-04-24 DIAGNOSIS — M779 Enthesopathy, unspecified: Secondary | ICD-10-CM | POA: Diagnosis not present

## 2017-04-30 ENCOUNTER — Ambulatory Visit: Payer: Self-pay | Admitting: Neurology

## 2017-06-01 ENCOUNTER — Other Ambulatory Visit: Payer: Self-pay | Admitting: Neurology

## 2017-06-03 ENCOUNTER — Other Ambulatory Visit: Payer: Self-pay | Admitting: Neurology

## 2017-06-22 DIAGNOSIS — M545 Low back pain: Secondary | ICD-10-CM | POA: Diagnosis not present

## 2017-06-26 DIAGNOSIS — Z125 Encounter for screening for malignant neoplasm of prostate: Secondary | ICD-10-CM | POA: Diagnosis not present

## 2017-06-26 DIAGNOSIS — Z0000 Encounter for general adult medical examination without abnormal findings: Secondary | ICD-10-CM | POA: Diagnosis not present

## 2017-06-26 DIAGNOSIS — E1165 Type 2 diabetes mellitus with hyperglycemia: Secondary | ICD-10-CM | POA: Diagnosis not present

## 2017-06-26 DIAGNOSIS — E7800 Pure hypercholesterolemia, unspecified: Secondary | ICD-10-CM | POA: Diagnosis not present

## 2017-06-26 DIAGNOSIS — Z1389 Encounter for screening for other disorder: Secondary | ICD-10-CM | POA: Diagnosis not present

## 2017-06-26 DIAGNOSIS — Z6838 Body mass index (BMI) 38.0-38.9, adult: Secondary | ICD-10-CM | POA: Diagnosis not present

## 2017-06-26 DIAGNOSIS — I10 Essential (primary) hypertension: Secondary | ICD-10-CM | POA: Diagnosis not present

## 2017-06-26 DIAGNOSIS — R569 Unspecified convulsions: Secondary | ICD-10-CM | POA: Diagnosis not present

## 2017-07-15 ENCOUNTER — Other Ambulatory Visit: Payer: Self-pay | Admitting: Neurology

## 2017-08-05 ENCOUNTER — Other Ambulatory Visit: Payer: Self-pay | Admitting: Neurology

## 2017-08-07 ENCOUNTER — Other Ambulatory Visit: Payer: Self-pay | Admitting: Neurology

## 2017-08-08 ENCOUNTER — Telehealth: Payer: Self-pay | Admitting: Neurology

## 2017-08-08 NOTE — Telephone Encounter (Signed)
I called the patient.  He did have a seizure this morning, he is feeling fatigued afterwards, this is just an event that occurred today.  He will have a revisit in early August, we will see him them, he will call if he has significant issues.  He has never had completely controlled seizures.

## 2017-08-08 NOTE — Telephone Encounter (Signed)
Pt's mother said he is not acting right. She said he is very tired, sleep, argumentative, will not talk. She said he is not himself. Said he sleeps and eats, that is it, no energy. She said if he knows she's call he will be angry with her, please do not tell him. She is asking Rn to call him. She feels he needs an appt. Pt's mother said she is at work and won't be able to talk. Please call to advise

## 2017-08-31 ENCOUNTER — Other Ambulatory Visit: Payer: Self-pay | Admitting: Neurology

## 2017-09-03 ENCOUNTER — Telehealth: Payer: Self-pay | Admitting: Neurology

## 2017-09-03 MED ORDER — PRAMIPEXOLE DIHYDROCHLORIDE 1 MG PO TABS: 1.0000 mg | ORAL_TABLET | Freq: Every day | ORAL | 0 refills | Status: DC

## 2017-09-03 NOTE — Telephone Encounter (Signed)
Short supply qty 7 tabs sent to pharmacy as requested until he receives from [facility] mail order.

## 2017-09-03 NOTE — Telephone Encounter (Signed)
Patient calling to get a Rx for pramipexole (MIRAPEX) 1 MG tablet  #7 sent to [facility] on [address]. He just needs 7 pills until he gets Rx from [facility].

## 2017-09-03 NOTE — Addendum Note (Signed)
Addended by: Hope Pigeon on: 09/03/2017 12:39 PM   Modules accepted: Orders

## 2017-09-04 ENCOUNTER — Telehealth: Payer: Self-pay | Admitting: *Deleted

## 2017-09-04 NOTE — Telephone Encounter (Signed)
Submitted PA lamotrigine 100mg  tab on covermymeds.  PA Case ID: [id number] - Rx #: [id number]. Waiting on determination.

## 2017-09-04 NOTE — Telephone Encounter (Signed)
PA approved. PA Case: [id number]. 09/04/2017 12:00:00 AM-09/05/2018 12:00:00 AM. Questions? Contact [phone].

## 2017-09-20 IMAGING — DX DG CHEST 2V
2 series · 2 of 2 positions shown · non-contrast
Comparison: None.

CLINICAL DATA: Left-sided chest pain.

EXAM:
CHEST  2 VIEW

[w chest pa]
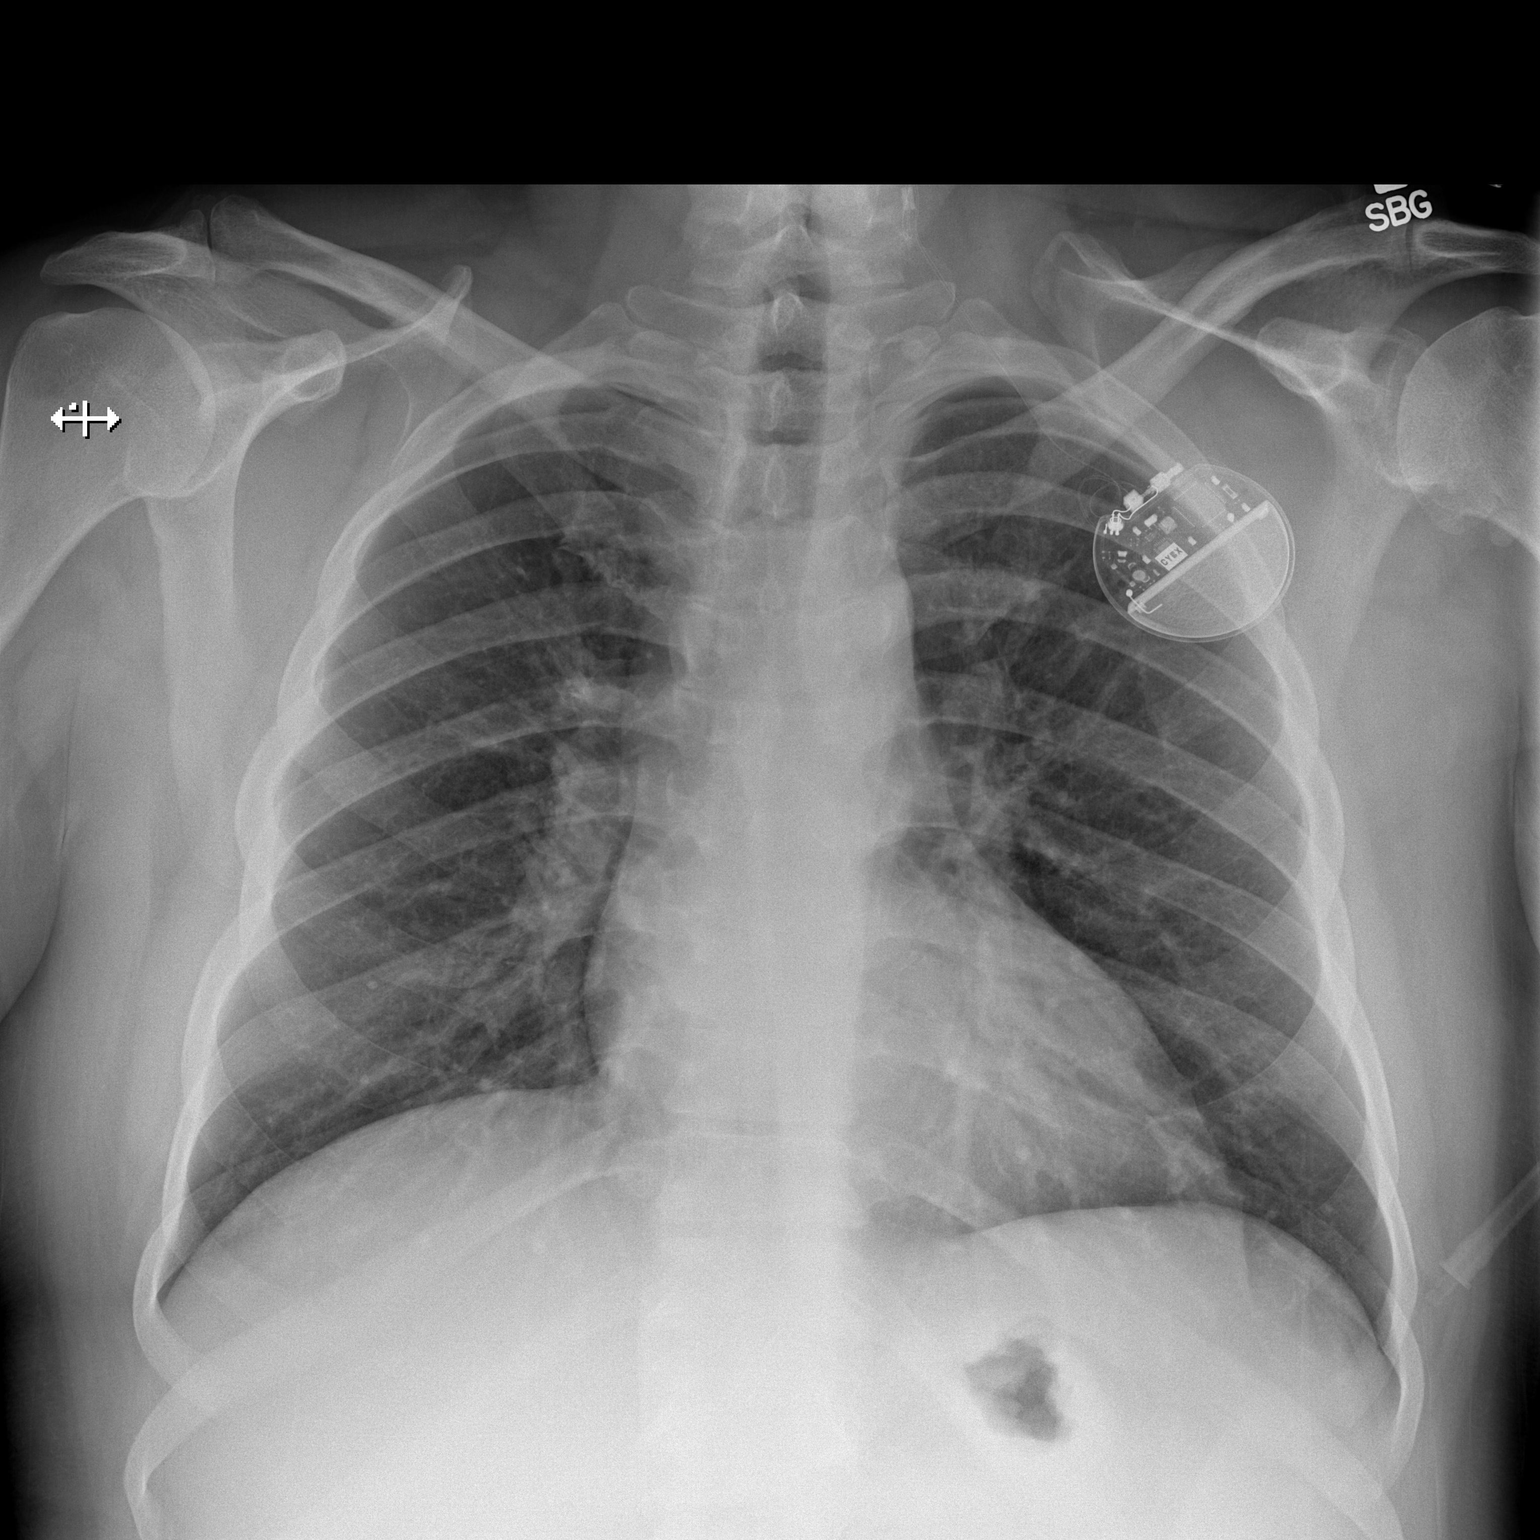

[w chest lat]
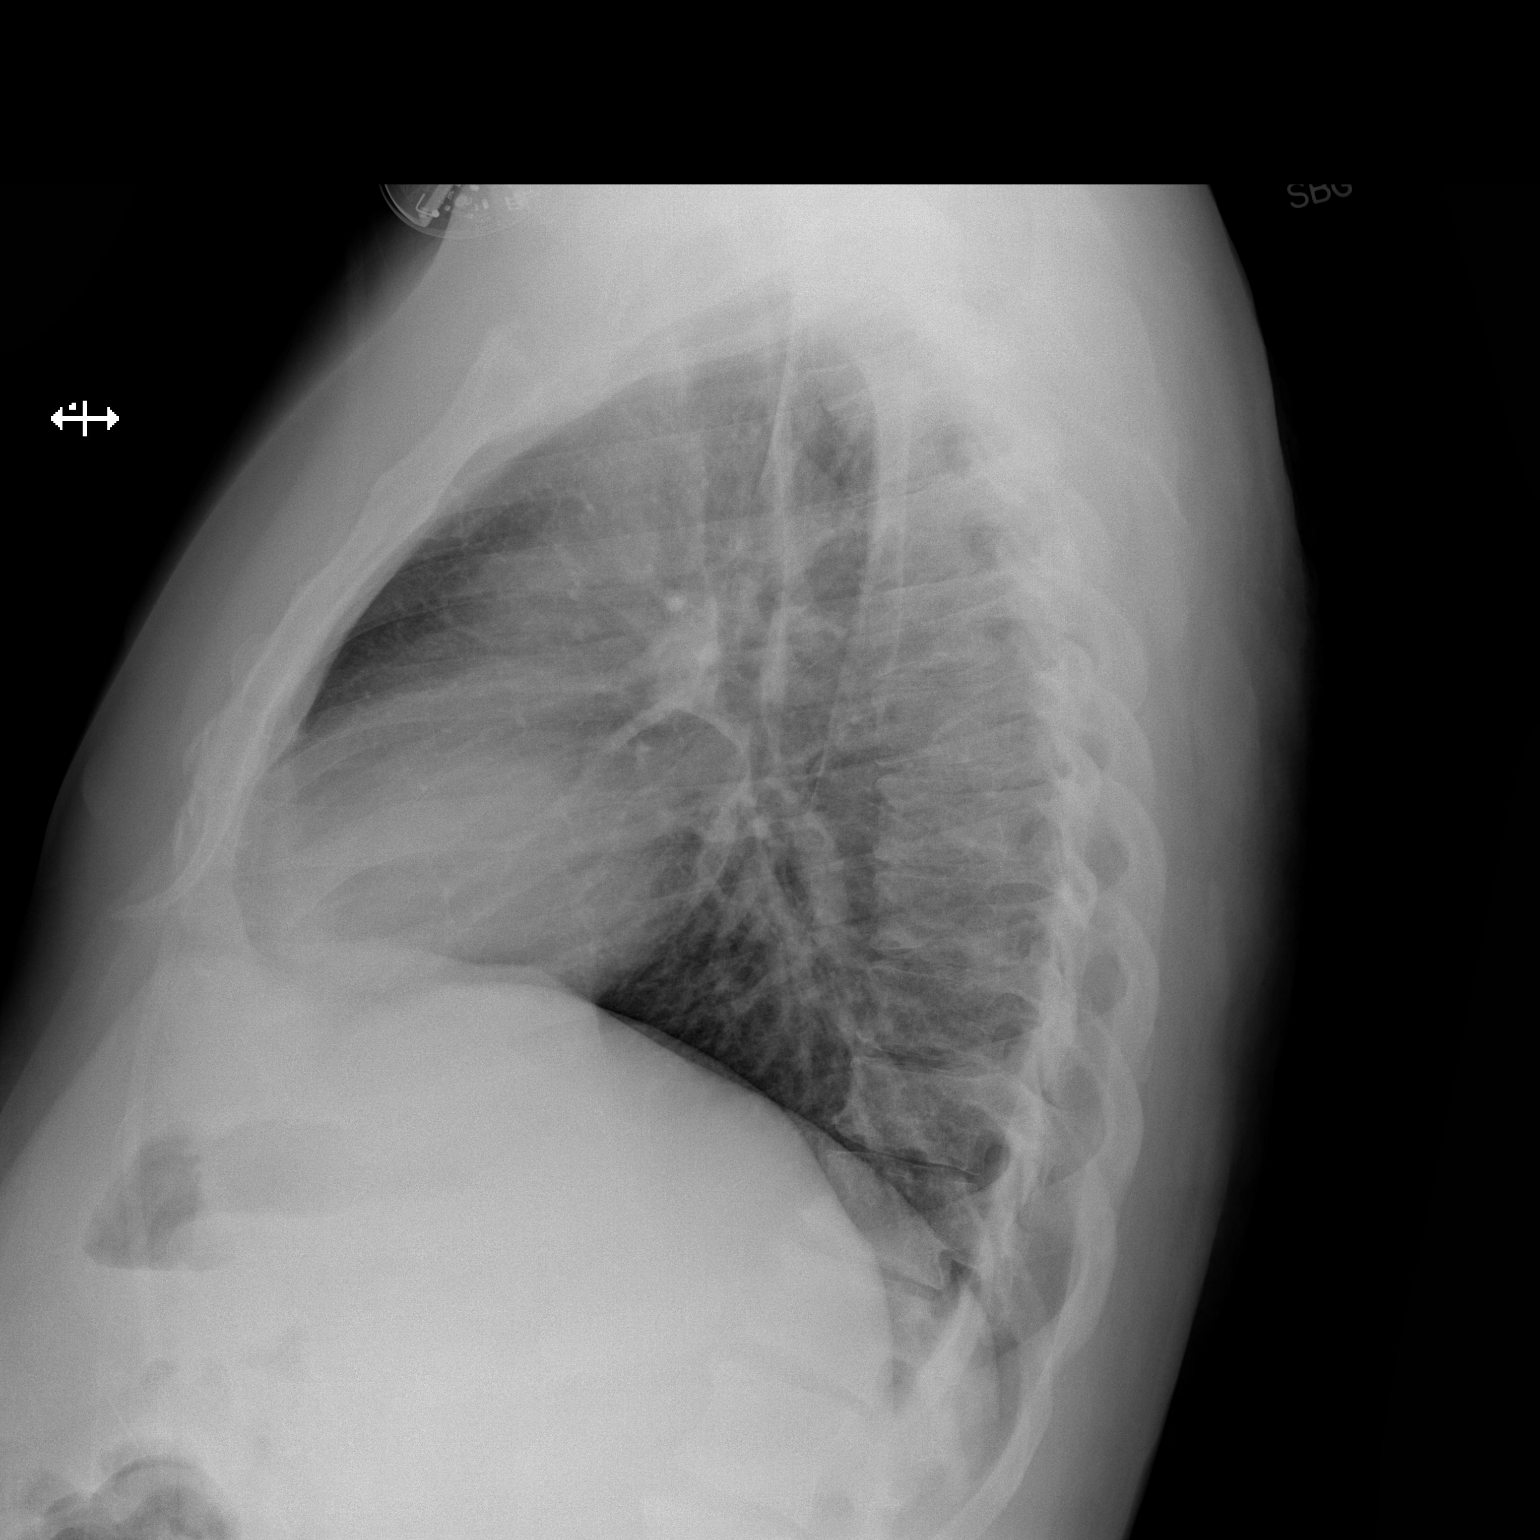

[2 of 2 positions shown; findings below may reference images not displayed]

FINDINGS: The heart size and mediastinal contours are within normal limits.
Vagus nerve stimulator device is again seen overlying the left
anterior chest wall with leads projecting up the left side of the
neck. Both lungs are clear. No pneumothorax is identified. The
visualized skeletal structures are unremarkable.
IMPRESSION: No active cardiopulmonary disease.

## 2017-09-23 ENCOUNTER — Encounter: Payer: Self-pay | Admitting: Neurology

## 2017-09-23 ENCOUNTER — Ambulatory Visit: Payer: Medicare HMO | Admitting: Neurology

## 2017-09-23 VITALS — BP 139/83 | HR 81 | Ht 68.0 in | Wt 249.0 lb

## 2017-09-23 DIAGNOSIS — G40119 Localization-related (focal) (partial) symptomatic epilepsy and epileptic syndromes with simple partial seizures, intractable, without status epilepticus: Secondary | ICD-10-CM | POA: Diagnosis not present

## 2017-09-23 DIAGNOSIS — G40319 Generalized idiopathic epilepsy and epileptic syndromes, intractable, without status epilepticus: Secondary | ICD-10-CM

## 2017-09-23 DIAGNOSIS — Z5181 Encounter for therapeutic drug level monitoring: Secondary | ICD-10-CM | POA: Diagnosis not present

## 2017-09-23 DIAGNOSIS — G2581 Restless legs syndrome: Secondary | ICD-10-CM

## 2017-09-23 MED ORDER — HYDROCODONE-ACETAMINOPHEN 5-325 MG PO TABS: 1.0000 | ORAL_TABLET | Freq: Four times a day (QID) | ORAL | 0 refills | Status: AC | PRN

## 2017-09-23 MED ORDER — PRAMIPEXOLE DIHYDROCHLORIDE 1.5 MG PO TABS: 1.5000 mg | ORAL_TABLET | Freq: Every day | ORAL | 3 refills | Status: AC

## 2017-09-23 MED ORDER — LACOSAMIDE 150 MG PO TABS: 1.0000 | ORAL_TABLET | Freq: Two times a day (BID) | ORAL | 1 refills | Status: DC

## 2017-09-23 NOTE — Progress Notes (Signed)
Reason for visit: Seizures  Eric Fernandez is an 42 y.o. male  History of present illness:  Eric Fernandez is a 42 year old right-handed white male with a history of intractable seizures.  The patient has episodes of staring off and then he has generalized seizure events.  The staring episodes may occur with some regularity, it has been 2 months since he had a generalized seizure.  The patient does not operate a motor vehicle.  The patient more recently has been moving a lot of heavy objects in his backyard, he has developed right elbow pain with certain movements.  He no longer has neck pain or pain down the arm.  The patient remains on Lamictal, Vimpat, and Dilantin.  He returns for an evaluation.  He takes Mirapex for restless leg syndrome.  Past Medical History:  Diagnosis Date  . Anxiety   . Depression   . Diabetes mellitus without complication ([place])   . Hypertension   . Mental disorder    border line bipolar  . Mild obesity   . Restless legs syndrome (RLS) 11/06/2012   Right leg  . Seizures (HCC)    Intractible  . Sleep apnea   . Suicide and self-inflicted injury ([place])   . Vertigo     Past Surgical History:  Procedure Laterality Date  . BURN DEBRIDEMENT SURGERY    . DIRECT LARYNGOSCOPY WITH RADIAESSE INJECTION  04/11/2011   Procedure: DIRECT LARYNGOSCOPY WITH RADIAESSE INJECTION;  Surgeon: Melida Quitter, MD;  Location: [facility];  Service: ENT;  Laterality: N/A;  MICRO DIRECT LARYNGOSCOPY WITH LEFT VOCAL FOLD RADIESSE INJECTION/JET VENTURI VENTILATION  . JOINT REPLACEMENT     rotator cuff surgery   lt  . SHOULDER SURGERY N/A   . VAGUS NERVE STIMULATOR INSERTION    . VAGUS NERVE STIMULATOR INSERTION Left 03/13/2016   Procedure: REPLACEMENT OF VAGAL NERVE STIMULATOR BATTERY;  Surgeon: Consuella Lose, MD;  Location: [facility];  Service: Neurosurgery;  Laterality: Left;  REPLACEMENT OF VAGAL NERVE STIMULATOR BATTERY    Family History  Problem Relation Age of Onset  . Diabetes  Mother   . ALS Maternal Aunt   . Cancer Maternal Uncle     Social history:  reports that he has quit smoking. He has never used smokeless tobacco. He reports that he does not drink alcohol or use drugs.    Allergies  Allergen Reactions  . No Known Allergies     Medications:  Prior to Admission medications   Medication Sig Start Date End Date Taking? Authorizing Provider  amLODipine (NORVASC) 5 MG tablet TAKE ONE TABLET BY MOUTH DAILY 04/12/14  Yes Kathrynn Ducking, MD  cholecalciferol (VITAMIN D) 1000 units tablet TAKE 1 TABLET TWICE DAILY 03/07/16  Yes Kathrynn Ducking, MD  DILANTIN 100 MG ER capsule TAKE 4 CAPSULES IN THE MORNING  AND TAKE 3 CAPSULES IN THE EVENING. 08/08/17  Yes Kathrynn Ducking, MD  escitalopram (LEXAPRO) 20 MG tablet TAKE 1 TABLET (20 MG TOTAL) BY MOUTH DAILY. PLEASE CALL [phone] TO SCHEDULE APPT. 08/06/17  Yes Kathrynn Ducking, MD  FARXIGA 10 MG TABS tablet Take 10 mg by mouth daily.  09/08/15  Yes [provider]  HYDROcodone-acetaminophen (NORCO/VICODIN) 5-325 MG tablet Take 1 tablet by mouth every 6 (six) hours as needed for moderate pain. 09/23/17  Yes Kathrynn Ducking, MD  ibuprofen (ADVIL,MOTRIN) 800 MG tablet Take 1 tablet (800 mg total) every 8 (eight) hours as needed by mouth (for pain). 12/25/16  Yes  Kathrynn Ducking, MD  JANUVIA 100 MG tablet Take 100 mg every morning by mouth.  08/26/15  Yes [provider]  lamoTRIgine (LAMICTAL) 100 MG tablet TAKE 3 TABLETS TWICE DAILY 12/27/16  Yes Kathrynn Ducking, MD  lisinopril (PRINIVIL,ZESTRIL) 10 MG tablet TAKE 1 TABLET EVERY DAY 12/27/16  Yes Kathrynn Ducking, MD  meclizine (ANTIVERT) 25 MG tablet TAKE 1 TABLET THREE TIMES DAILY AS NEEDED FOR DIZZINESS 10/01/16  Yes Kathrynn Ducking, MD  metFORMIN (GLUCOPHAGE) 500 MG tablet Take 1,000 mg by mouth 2 (two) times daily with a meal.  11/23/13  Yes [provider]  methocarbamol (ROBAXIN) 500 MG tablet Take 1 tablet (500 mg total) by  mouth 3 (three) times daily. 12/14/16  Yes Kathrynn Ducking, MD  pravastatin (PRAVACHOL) 40 MG tablet Take 40 mg by mouth at bedtime.    Yes [provider]  zolpidem (AMBIEN) 10 MG tablet TAKE 1 TABLET AT BEDTIME AS NEEDED FOR SLEEP 10/01/16  Yes Kathrynn Ducking, MD  Lacosamide (VIMPAT) 150 MG TABS Take 1 tablet (150 mg total) by mouth 2 (two) times daily. 09/23/17   Kathrynn Ducking, MD  pramipexole (MIRAPEX) 1.5 MG tablet Take 1 tablet (1.5 mg total) by mouth at bedtime. 09/23/17   Kathrynn Ducking, MD    ROS:  Out of a complete 14 system review of symptoms, the patient complains only of the following symptoms, and all other reviewed systems are negative.  Seizures Right arm pain  Blood pressure 139/83, pulse 81, height 5\' 8"  (1.727 m), weight 249 lb (112.9 kg).  Physical Exam  General: The patient is alert and cooperative at the time of the examination.  The patient is moderately obese.  Neuromuscular: The patient has lateral elbow discomfort with supination and with extension of the hand.  Skin: No significant peripheral edema is noted.   Neurologic Exam  Mental status: The patient is alert and oriented x 3 at the time of the examination. The patient has apparent normal recent and remote memory, with an apparently normal attention span and concentration ability.   Cranial nerves: Facial symmetry is present. Speech is normal, no aphasia or dysarthria is noted. Extraocular movements are full. Visual fields are full.  Motor: The patient has good strength in all 4 extremities.  Sensory examination: Soft touch sensation is symmetric on the face, arms, and legs.  Coordination: The patient has good finger-nose-finger and heel-to-shin bilaterally.  Gait and station: The patient has a normal gait. Tandem gait is normal. Romberg is negative. No drift is seen.  Reflexes: Deep tendon reflexes are symmetric.   Assessment/Plan:  1.  Intractable seizures  2.  Restless  leg syndrome  3.  Right arm pain, probable lateral epicondylitis  The patient will cease performing heavy tasks with his right arm, he will try to rest the arm and left the arm heel.  The patient will continue his antiepileptic medications, a prescription for Vimpat was sent in.  The patient will have blood work done today.  Vagal nerve stimulator was interrogated and reprogrammed.  The Mirapex will be increased to 1.5 mg at night, he is having increased symptoms with restless leg syndrome.  He will follow-up in 6 months.  Jill Alexanders MD 09/23/2017 10:15 AM  [facility] [address] [place], [place] [address]  Phone [phone] Fax [phone]

## 2017-09-23 NOTE — Procedures (Signed)
     History:Eric Fernandez is a 42 year old gentleman with intractable epilepsy and bipolar disorder. He has had ongoing generalized seizures, episodes of staring.  His vagal nerve stimulator is being reprogrammed.    VAGAL NERVE STIMULATOR SETTINGS: Output current: 1.50 milliamps  Signal frequency: 30 Hz  Pulse width: 500 microseconds   On time: 30 seconds  Off time: 1.1 minutes  Magnet current: 2.0 milliamps  Magnet on time: 60 seconds  Pulse width: 250 microseconds  Activations: 109  Output current: 1.5 milliamps  Cardiac monitor stimulation: Turned on from 0.25 to 1.750 milliamps   Pulse width: 500 ms   On time: 60 seconds   Lead impedance: 2655 Ohms  Activations: 11  IFI: No  The VNS unit was interrogated, and the settings were changed as above. The patient appeared to tolerate the changes well.  Jill Alexanders MD 2/1/20178:57 AM  [facility] [address] [place], [place] [address]  Phone [phone] Fax [phone]

## 2017-09-25 ENCOUNTER — Telehealth: Payer: Self-pay

## 2017-09-25 LAB — CBC WITH DIFFERENTIAL/PLATELET
BASOS ABS: 0 10*3/uL (ref 0.0–0.2)
Basos: 0 %
EOS (ABSOLUTE): 0 10*3/uL (ref 0.0–0.4)
Eos: 1 %
HEMOGLOBIN: 15.1 g/dL (ref 13.0–17.7)
Hematocrit: 43.8 % (ref 37.5–51.0)
Immature Grans (Abs): 0 10*3/uL (ref 0.0–0.1)
Immature Granulocytes: 0 %
Lymphocytes Absolute: 1.6 10*3/uL (ref 0.7–3.1)
Lymphs: 24 %
MCH: 31.1 pg (ref 26.6–33.0)
MCHC: 34.5 g/dL (ref 31.5–35.7)
MCV: 90 fL (ref 79–97)
MONOCYTES: 6 %
Monocytes Absolute: 0.4 10*3/uL (ref 0.1–0.9)
Neutrophils Absolute: 4.5 10*3/uL (ref 1.4–7.0)
Neutrophils: 69 %
PLATELETS: 279 10*3/uL (ref 150–450)
RBC: 4.85 x10E6/uL (ref 4.14–5.80)
RDW: 13.9 % (ref 12.3–15.4)
WBC: 6.6 10*3/uL (ref 3.4–10.8)

## 2017-09-25 LAB — COMPREHENSIVE METABOLIC PANEL
A/G RATIO: 2.5 — AB (ref 1.2–2.2)
ALK PHOS: 136 IU/L — AB (ref 39–117)
ALT: 16 IU/L (ref 0–44)
AST: 16 IU/L (ref 0–40)
Albumin: 4.8 g/dL (ref 3.5–5.5)
BUN/Creatinine Ratio: 15 (ref 9–20)
BUN: 10 mg/dL (ref 6–24)
Bilirubin Total: <0.2 mg/dL (ref 0.0–1.2)
CHLORIDE: 98 mmol/L (ref 96–106)
CO2: 23 mmol/L (ref 20–29)
Calcium: 9.4 mg/dL (ref 8.7–10.2)
Creatinine, Ser: 0.66 mg/dL — ABNORMAL LOW (ref 0.76–1.27)
GFR calc Af Amer: 139 mL/min/{1.73_m2} (ref >59)
GFR calc non Af Amer: 120 mL/min/{1.73_m2} (ref >59)
Globulin, Total: 1.9 g/dL (ref 1.5–4.5)
Glucose: 111 mg/dL — ABNORMAL HIGH (ref 65–99)
POTASSIUM: 4.5 mmol/L (ref 3.5–5.2)
Sodium: 139 mmol/L (ref 134–144)
Total Protein: 6.7 g/dL (ref 6.0–8.5)

## 2017-09-25 LAB — PHENYTOIN LEVEL, TOTAL: PHENYTOIN (DILANTIN), SERUM: 22.9 ug/mL — AB (ref 10.0–20.0)

## 2017-09-25 LAB — LAMOTRIGINE LEVEL: LAMOTRIGINE LVL: 3.9 ug/mL (ref 2.0–20.0)

## 2017-09-25 NOTE — Telephone Encounter (Signed)
I called and spoke with patient about his lab results. He voiced understanding and appreciation and did not have any questions at this time.

## 2017-09-25 NOTE — Telephone Encounter (Signed)
-----   Message from Kathrynn Ducking, MD sent at 09/25/2017  2:16 PM EDT ----- Blood work shows a mild elevation in the alkaline phosphatase level that likely related to the Dilantin, otherwise comprehensive metabolic profile and CBC were unremarkable.  Dilantin level is in the low toxic range but this is stable, similar to what was seen previously, no change in dose.  Lamictal level is therapeutic, no change in dose.  Please call the patient. ----- Message ----- From: Lavone Neri Lab Results In Sent: 09/24/2017   7:39 AM EDT To: Kathrynn Ducking, MD

## 2017-10-06 ENCOUNTER — Emergency Department (HOSPITAL_COMMUNITY)
Admission: EM | Admit: 2017-10-06 | Discharge: 2017-10-06 | Disposition: A | Discharge status: Home / Self Care | Payer: Medicare HMO | Attending: Emergency Medicine | Admitting: Emergency Medicine

## 2017-10-06 ENCOUNTER — Encounter (HOSPITAL_COMMUNITY): Payer: Self-pay | Admitting: Emergency Medicine

## 2017-10-06 ENCOUNTER — Other Ambulatory Visit: Payer: Self-pay

## 2017-10-06 DIAGNOSIS — Z5329 Procedure and treatment not carried out because of patient's decision for other reasons: Secondary | ICD-10-CM

## 2017-10-06 DIAGNOSIS — R404 Transient alteration of awareness: Secondary | ICD-10-CM | POA: Diagnosis not present

## 2017-10-06 DIAGNOSIS — R569 Unspecified convulsions: Secondary | ICD-10-CM | POA: Diagnosis not present

## 2017-10-06 DIAGNOSIS — I959 Hypotension, unspecified: Secondary | ICD-10-CM | POA: Diagnosis not present

## 2017-10-06 DIAGNOSIS — R410 Disorientation, unspecified: Secondary | ICD-10-CM | POA: Diagnosis not present

## 2017-10-06 DIAGNOSIS — Z5320 Procedure and treatment not carried out because of patient's decision for unspecified reasons: Secondary | ICD-10-CM

## 2017-10-06 DIAGNOSIS — Z5321 Procedure and treatment not carried out due to patient leaving prior to being seen by health care provider: Secondary | ICD-10-CM | POA: Insufficient documentation

## 2017-10-06 DIAGNOSIS — R000 Tachycardia, unspecified: Secondary | ICD-10-CM | POA: Diagnosis not present

## 2017-10-06 NOTE — ED Provider Notes (Cosign Needed)
Patient requesting to leave AMA prior to my evaluation as I walked into the room.  He states "I want to sign myself out, that's just what I do all the time."  Patient does not want any evaluation or treatment.  He appears in NAD, speaking in complete sentences. Family at bedside.   Delia Heady, PA-C 10/06/17 1922

## 2017-10-06 NOTE — ED Notes (Signed)
Pt expressing desire to leave AMA.  Hina PA aware and spoke with pt.  Pt informed of risk of leaving and expressed understanding.  Pt ambulated out of department with a steady gait escorted by family.

## 2017-10-06 NOTE — ED Notes (Signed)
Bed: WA03 Expected date:  Expected time:  Means of arrival:  Comments: Seizures

## 2017-10-06 NOTE — ED Triage Notes (Signed)
Patient from home with witnessed seizure. Appears to still be post ichtal. Fell from chair, but no obvious signs of trauma.

## 2017-10-06 NOTE — ED Notes (Signed)
Patient wants to sign himself out.

## 2017-10-09 ENCOUNTER — Other Ambulatory Visit: Payer: Self-pay | Admitting: Neurology

## 2017-10-26 DIAGNOSIS — R0902 Hypoxemia: Secondary | ICD-10-CM | POA: Diagnosis not present

## 2017-10-26 DIAGNOSIS — R569 Unspecified convulsions: Secondary | ICD-10-CM | POA: Diagnosis not present

## 2017-10-26 DIAGNOSIS — W19XXXA Unspecified fall, initial encounter: Secondary | ICD-10-CM | POA: Diagnosis not present

## 2017-11-06 ENCOUNTER — Other Ambulatory Visit: Payer: Self-pay | Admitting: Neurology

## 2017-11-26 DIAGNOSIS — H52209 Unspecified astigmatism, unspecified eye: Secondary | ICD-10-CM | POA: Diagnosis not present

## 2017-11-26 DIAGNOSIS — H5203 Hypermetropia, bilateral: Secondary | ICD-10-CM | POA: Diagnosis not present

## 2017-11-26 DIAGNOSIS — H524 Presbyopia: Secondary | ICD-10-CM | POA: Diagnosis not present

## 2018-01-20 ENCOUNTER — Other Ambulatory Visit: Payer: Self-pay | Admitting: Neurology

## 2018-01-21 NOTE — Telephone Encounter (Signed)
Refill for Lamotrigine and Mirapex refilled. Mirapex was increased to 1.5 mg daily at bed time at office visit on 09/23/2017. Refill submitted reflecting the increase. MB RN.

## 2018-02-26 ENCOUNTER — Other Ambulatory Visit: Payer: Self-pay | Admitting: Neurology

## 2018-03-31 ENCOUNTER — Other Ambulatory Visit: Payer: Self-pay | Admitting: Neurology

## 2018-04-01 ENCOUNTER — Ambulatory Visit: Payer: Medicare HMO | Admitting: Neurology

## 2018-04-01 ENCOUNTER — Encounter: Payer: Self-pay | Admitting: Neurology

## 2018-04-01 ENCOUNTER — Encounter

## 2018-04-01 VITALS — BP 126/75 | HR 82 | Ht 68.0 in | Wt 241.0 lb

## 2018-04-01 DIAGNOSIS — G40119 Localization-related (focal) (partial) symptomatic epilepsy and epileptic syndromes with simple partial seizures, intractable, without status epilepticus: Secondary | ICD-10-CM

## 2018-04-01 DIAGNOSIS — G2581 Restless legs syndrome: Secondary | ICD-10-CM | POA: Diagnosis not present

## 2018-04-01 DIAGNOSIS — Z5181 Encounter for therapeutic drug level monitoring: Secondary | ICD-10-CM

## 2018-04-01 DIAGNOSIS — G40319 Generalized idiopathic epilepsy and epileptic syndromes, intractable, without status epilepticus: Secondary | ICD-10-CM

## 2018-04-01 NOTE — Progress Notes (Signed)
Reason for visit: Seizures  Eric Fernandez is an 43 y.o. male  History of present illness:  Eric Fernandez is a 43 year old right-handed white male with a history of intractable seizures.  The patient was in the emergency room on 06 October 2017 with a seizure, he has not been in the ER since that time.  He continues to have seizures off and on, however.  His last seizure was about a month ago.  He has sleep apnea but he cannot tolerate the mask, he reports that he does have chronic fatigue.  He has restless leg syndrome, the Mirapex has been helpful in this regard.  He is complaining of some increase in anxiety, he remains on Lexapro.  The patient has a vagal nerve stimulator in place which he tolerates fairly well.  The patient is on Vimpat, Lamictal, and Dilantin.  The patient tends to run a low toxic level of Dilantin, but he tolerates this well.  He returns to this office for an evaluation.  Past Medical History:  Diagnosis Date  . Anxiety   . Depression   . Diabetes mellitus without complication ([place])   . Hypertension   . Mental disorder    border line bipolar  . Mild obesity   . Restless legs syndrome (RLS) 11/06/2012   Right leg  . Seizures (HCC)    Intractible  . Sleep apnea   . Suicide and self-inflicted injury ([place])   . Vertigo     Past Surgical History:  Procedure Laterality Date  . BURN DEBRIDEMENT SURGERY    . DIRECT LARYNGOSCOPY WITH RADIAESSE INJECTION  04/11/2011   Procedure: DIRECT LARYNGOSCOPY WITH RADIAESSE INJECTION;  Surgeon: Melida Quitter, MD;  Location: [facility];  Service: ENT;  Laterality: N/A;  MICRO DIRECT LARYNGOSCOPY WITH LEFT VOCAL FOLD RADIESSE INJECTION/JET VENTURI VENTILATION  . JOINT REPLACEMENT     rotator cuff surgery   lt  . SHOULDER SURGERY N/A   . VAGUS NERVE STIMULATOR INSERTION    . VAGUS NERVE STIMULATOR INSERTION Left 03/13/2016   Procedure: REPLACEMENT OF VAGAL NERVE STIMULATOR BATTERY;  Surgeon: Consuella Lose, MD;  Location: [facility];   Service: Neurosurgery;  Laterality: Left;  REPLACEMENT OF VAGAL NERVE STIMULATOR BATTERY    Family History  Problem Relation Age of Onset  . Diabetes Mother   . ALS Maternal Aunt   . Cancer Maternal Uncle     Social history:  reports that he has quit smoking. He has never used smokeless tobacco. He reports that he does not drink alcohol or use drugs.    Allergies  Allergen Reactions  . No Known Allergies     Medications:  Prior to Admission medications   Medication Sig Start Date End Date Taking? Authorizing Provider  amLODipine (NORVASC) 5 MG tablet TAKE ONE TABLET BY MOUTH DAILY 04/12/14  Yes Kathrynn Ducking, MD  cholecalciferol (VITAMIN D) 1000 units tablet TAKE 1 TABLET TWICE DAILY 03/07/16  Yes Kathrynn Ducking, MD  DILANTIN 100 MG ER capsule TAKE 4 CAPSULES IN THE MORNING  AND TAKE 3 CAPSULES IN THE EVENING. 11/07/17  Yes Kathrynn Ducking, MD  escitalopram (LEXAPRO) 20 MG tablet TAKE 1 TABLET EVERY DAY 02/27/18  Yes Kathrynn Ducking, MD  FARXIGA 10 MG TABS tablet Take 10 mg by mouth daily.  09/08/15  Yes [provider]  HYDROcodone-acetaminophen (NORCO/VICODIN) 5-325 MG tablet Take 1 tablet by mouth every 6 (six) hours as needed for moderate pain. 09/23/17  Yes Kathrynn Ducking,  MD  ibuprofen (ADVIL,MOTRIN) 800 MG tablet Take 1 tablet (800 mg total) every 8 (eight) hours as needed by mouth (for pain). 12/25/16  Yes Kathrynn Ducking, MD  JANUVIA 100 MG tablet Take 100 mg every morning by mouth.  08/26/15  Yes [provider]  Lacosamide (VIMPAT) 150 MG TABS Take 1 tablet (150 mg total) by mouth 2 (two) times daily. 09/23/17  Yes Kathrynn Ducking, MD  lamoTRIgine (LAMICTAL) 100 MG tablet TAKE 3 TABLETS TWICE DAILY 01/21/18  Yes Kathrynn Ducking, MD  lisinopril (PRINIVIL,ZESTRIL) 10 MG tablet TAKE 1 TABLET EVERY DAY 12/27/16  Yes Kathrynn Ducking, MD  meclizine (ANTIVERT) 25 MG tablet TAKE 1 TABLET THREE TIMES DAILY AS NEEDED FOR DIZZINESS 10/01/16  Yes Kathrynn Ducking, MD  metFORMIN (GLUCOPHAGE) 500 MG tablet Take 1,000 mg by mouth 2 (two) times daily with a meal.  11/23/13  Yes [provider]  methocarbamol (ROBAXIN) 500 MG tablet Take 1 tablet (500 mg total) by mouth 3 (three) times daily. 12/14/16  Yes Kathrynn Ducking, MD  pramipexole (MIRAPEX) 1 MG tablet Take 1.5 tablets (1.5 mg total) by mouth at bedtime. 01/21/18  Yes Kathrynn Ducking, MD  pramipexole (MIRAPEX) 1.5 MG tablet Take 1 tablet (1.5 mg total) by mouth at bedtime. 09/23/17  Yes Kathrynn Ducking, MD  pravastatin (PRAVACHOL) 40 MG tablet Take 40 mg by mouth at bedtime.    Yes [provider]  zolpidem (AMBIEN) 10 MG tablet TAKE 1 TABLET AT BEDTIME AS NEEDED FOR SLEEP 10/01/16  Yes Kathrynn Ducking, MD    ROS:  Out of a complete 14 system review of symptoms, the patient complains only of the following symptoms, and all other reviewed systems are negative.  Seizures Fatigue Memory loss, confusion, headache, weakness, dizziness, passing out, tremor Restless legs Depression, anxiety, none of sleep, decreased energy, racing thoughts  Blood pressure 126/75, pulse 82, height 5\' 8"  (1.727 m), weight 241 lb (109.3 kg).  Physical Exam  General: The patient is alert and cooperative at the time of the examination.  The patient is markedly obese.  Skin: No significant peripheral edema is noted.   Neurologic Exam  Mental status: The patient is alert and oriented x 3 at the time of the examination. The patient has apparent normal recent and remote memory, with an apparently normal attention span and concentration ability.   Cranial nerves: Facial symmetry is present. Speech is normal, no aphasia or dysarthria is noted. Extraocular movements are full. Visual fields are full.  Motor: The patient has good strength in all 4 extremities.  Sensory examination: Soft touch sensation is symmetric on the face, arms, and legs.  Coordination: The patient has good  finger-nose-finger and heel-to-shin bilaterally.  Gait and station: The patient has a normal gait. Tandem gait is normal. Romberg is negative. No drift is seen.  Reflexes: Deep tendon reflexes are symmetric.   Assessment/Plan:  1.  Intractable seizures  2.  Anxiety, depression  3.  Untreated sleep apnea  The patient will undergo blood work today.  The vagal nerve stimulator was adjusted, he seems to tolerate this fairly well.  We may consider increasing the Lamictal or Vimpat dose depending upon the levels.  He will follow-up in 6 months.  I will call him when I get the results of the blood work.  Jill Alexanders MD 04/01/2018 8:17 AM  [facility] [address] [place], [place] [address]  Phone [phone] Fax [phone]

## 2018-04-01 NOTE — Procedures (Signed)
     History:Eric Fernandez is a 43 year old gentleman with intractable epilepsy and bipolar disorder. He has had ongoing generalized seizures, episodes of staring.  His vagal nerve stimulator is being reprogrammed.    VAGAL NERVE STIMULATOR SETTINGS: Output current: 1.50 milliamps changed to 1.65 mA  Signal frequency: 30 Hz  Pulse width:530microseconds   On time: 30 seconds  Off time: 1.1 minutes  Magnet current: 2.0 milliamps  Magnet on time: 60 seconds  Pulse width: 250 microseconds  Activations: 109  Output current: 1.5 milliamps  Cardiac monitor stimulation:1.767milliamps changed to 1.875 mA    Pulse width: 500 ms   On time: 60 seconds  Lead impedance:2591ohms  Activations:64  IFI: No  The VNS unit was interrogated, and the settings were changed as above. The patient appeared to tolerate the changes well.  Jill Alexanders MD 2/1/20178:57 AM  [facility] [address] [place], [place] [address]  Phone [phone] Fax [phone]

## 2018-04-02 ENCOUNTER — Telehealth: Payer: Self-pay | Admitting: Neurology

## 2018-04-02 MED ORDER — LACOSAMIDE 150 MG PO TABS: 1.0000 | ORAL_TABLET | Freq: Two times a day (BID) | ORAL | 1 refills | Status: AC

## 2018-04-02 NOTE — Telephone Encounter (Signed)
The Vimpat prescription was refilled.

## 2018-04-02 NOTE — Telephone Encounter (Signed)
Pt requesting a refill on Vimpat 150 mg. [place] drug registry check and last refill was written on 09/23/17 # 174 for a 90 day supply. Pt has been complaint with office visits.

## 2018-04-02 NOTE — Telephone Encounter (Signed)
Pt request refill for Lacosamide (VIMPAT) 150 MG TABS sent to [facility]. Pt said he is out.

## 2018-04-03 DIAGNOSIS — I499 Cardiac arrhythmia, unspecified: Secondary | ICD-10-CM | POA: Diagnosis not present

## 2018-04-03 DIAGNOSIS — R404 Transient alteration of awareness: Secondary | ICD-10-CM | POA: Diagnosis not present

## 2018-04-03 DIAGNOSIS — R4020 Unspecified coma: Secondary | ICD-10-CM | POA: Diagnosis not present

## 2018-04-03 DIAGNOSIS — R0689 Other abnormalities of breathing: Secondary | ICD-10-CM | POA: Diagnosis not present

## 2018-04-04 ENCOUNTER — Telehealth: Payer: Self-pay | Admitting: Neurology

## 2018-04-04 LAB — COMPREHENSIVE METABOLIC PANEL
ALBUMIN: 5 g/dL (ref 4.0–5.0)
ALT: 15 IU/L (ref 0–44)
AST: 13 IU/L (ref 0–40)
Albumin/Globulin Ratio: 2.6 — ABNORMAL HIGH (ref 1.2–2.2)
Alkaline Phosphatase: 125 IU/L — ABNORMAL HIGH (ref 39–117)
BUN/Creatinine Ratio: 19 (ref 9–20)
BUN: 15 mg/dL (ref 6–24)
Bilirubin Total: <0.2 mg/dL (ref 0.0–1.2)
CALCIUM: 9.6 mg/dL (ref 8.7–10.2)
CO2: 22 mmol/L (ref 20–29)
CREATININE: 0.77 mg/dL (ref 0.76–1.27)
Chloride: 99 mmol/L (ref 96–106)
GFR, EST AFRICAN AMERICAN: 129 mL/min/{1.73_m2} (ref >59)
GFR, EST NON AFRICAN AMERICAN: 112 mL/min/{1.73_m2} (ref >59)
Globulin, Total: 1.9 g/dL (ref 1.5–4.5)
Glucose: 117 mg/dL — ABNORMAL HIGH (ref 65–99)
Potassium: 5.1 mmol/L (ref 3.5–5.2)
Sodium: 140 mmol/L (ref 134–144)
TOTAL PROTEIN: 6.9 g/dL (ref 6.0–8.5)

## 2018-04-04 LAB — CBC WITH DIFFERENTIAL/PLATELET
BASOS: 1 %
Basophils Absolute: 0.1 10*3/uL (ref 0.0–0.2)
EOS (ABSOLUTE): 0 10*3/uL (ref 0.0–0.4)
EOS: 1 %
HEMOGLOBIN: 15.7 g/dL (ref 13.0–17.7)
Hematocrit: 44.6 % (ref 37.5–51.0)
IMMATURE GRANULOCYTES: 0 %
Immature Grans (Abs): 0 10*3/uL (ref 0.0–0.1)
Lymphocytes Absolute: 1.4 10*3/uL (ref 0.7–3.1)
Lymphs: 19 %
MCH: 30.7 pg (ref 26.6–33.0)
MCHC: 35.2 g/dL (ref 31.5–35.7)
MCV: 87 fL (ref 79–97)
MONOS ABS: 0.4 10*3/uL (ref 0.1–0.9)
Monocytes: 5 %
NEUTROS ABS: 5.5 10*3/uL (ref 1.4–7.0)
NEUTROS PCT: 74 %
Platelets: 319 10*3/uL (ref 150–450)
RBC: 5.12 x10E6/uL (ref 4.14–5.80)
RDW: 12.8 % (ref 11.6–15.4)
WBC: 7.4 10*3/uL (ref 3.4–10.8)

## 2018-04-04 LAB — LACOSAMIDE

## 2018-04-04 LAB — PHENYTOIN LEVEL, TOTAL: Phenytoin (Dilantin), Serum: 19 ug/mL (ref 10.0–20.0)

## 2018-04-04 LAB — LAMOTRIGINE LEVEL: LAMOTRIGINE LVL: 3.7 ug/mL (ref 2.0–20.0)

## 2018-04-04 NOTE — Telephone Encounter (Signed)
I called the patient.  Blood work is relatively unremarkable, alkaline phosphatase is slightly elevated related to the use of Dilantin.  The Vimpat levels are 0, the patient apparently ran out of the medication before the blood draw, he will be getting back on this.  The Lamictal level is in the low therapeutic range, could potentially be increased in the future.

## 2018-04-07 ENCOUNTER — Telehealth: Payer: Self-pay | Admitting: Neurology

## 2018-04-07 NOTE — Telephone Encounter (Signed)
I have been informed that the patient has been deceased.  Likely associated with status epilepticus, the patient had apparently run out of his Vimpat, blood test done recently showed a level of 0.

## 2018-04-13 DIAGNOSIS — 419620001 Death: Secondary | SNOMED CT | POA: Diagnosis not present

## 2018-04-13 DEATH — deceased

## 2018-10-07 ENCOUNTER — Ambulatory Visit: Payer: Medicare HMO | Admitting: Neurology
